# Patient Record
Sex: Female | Born: 1955 | Race: White | Hispanic: No | Marital: Married | Smoking: Never smoker
Health system: Southern US, Community
[De-identification: ages and names within clinical notes are randomized; demographics above are authoritative.]

## PROBLEM LIST (undated history)

## (undated) DIAGNOSIS — F32A Depression, unspecified: Secondary | ICD-10-CM

## (undated) DIAGNOSIS — Z973 Presence of spectacles and contact lenses: Secondary | ICD-10-CM

## (undated) DIAGNOSIS — Z789 Other specified health status: Secondary | ICD-10-CM

## (undated) DIAGNOSIS — F329 Major depressive disorder, single episode, unspecified: Secondary | ICD-10-CM

## (undated) DIAGNOSIS — F419 Anxiety disorder, unspecified: Secondary | ICD-10-CM

## (undated) HISTORY — PX: COLONOSCOPY: SHX174

## (undated) HISTORY — DX: Anxiety disorder, unspecified: F41.9

## (undated) HISTORY — PX: WISDOM TOOTH EXTRACTION: SHX21

---

## 1999-03-13 ENCOUNTER — Other Ambulatory Visit: Admission: RE | Admit: 1999-03-13 | Discharge: 1999-03-13 | Payer: Self-pay | Admitting: Obstetrics and Gynecology

## 2000-05-14 ENCOUNTER — Other Ambulatory Visit: Admission: RE | Admit: 2000-05-14 | Discharge: 2000-05-14 | Payer: Self-pay | Admitting: Obstetrics and Gynecology

## 2000-05-27 ENCOUNTER — Encounter: Payer: Self-pay | Admitting: Obstetrics and Gynecology

## 2000-05-27 ENCOUNTER — Encounter: Admission: RE | Admit: 2000-05-27 | Discharge: 2000-05-27 | Payer: Self-pay | Admitting: Obstetrics and Gynecology

## 2001-05-27 ENCOUNTER — Other Ambulatory Visit: Admission: RE | Admit: 2001-05-27 | Discharge: 2001-05-27 | Payer: Self-pay | Admitting: Obstetrics and Gynecology

## 2002-06-07 ENCOUNTER — Other Ambulatory Visit: Admission: RE | Admit: 2002-06-07 | Discharge: 2002-06-07 | Payer: Self-pay | Admitting: Obstetrics and Gynecology

## 2003-07-01 ENCOUNTER — Other Ambulatory Visit: Admission: RE | Admit: 2003-07-01 | Discharge: 2003-07-01 | Payer: Self-pay | Admitting: Obstetrics and Gynecology

## 2003-09-02 ENCOUNTER — Encounter: Admission: RE | Admit: 2003-09-02 | Discharge: 2003-09-02 | Payer: Self-pay | Admitting: Family Medicine

## 2004-07-03 ENCOUNTER — Other Ambulatory Visit: Admission: RE | Admit: 2004-07-03 | Discharge: 2004-07-03 | Payer: Self-pay | Admitting: Obstetrics and Gynecology

## 2004-08-01 ENCOUNTER — Encounter: Admission: RE | Admit: 2004-08-01 | Discharge: 2004-08-01 | Payer: Self-pay | Admitting: Obstetrics and Gynecology

## 2005-03-28 ENCOUNTER — Encounter: Admission: RE | Admit: 2005-03-28 | Discharge: 2005-03-28 | Payer: Self-pay | Admitting: Family Medicine

## 2005-07-09 ENCOUNTER — Other Ambulatory Visit: Admission: RE | Admit: 2005-07-09 | Discharge: 2005-07-09 | Payer: Self-pay | Admitting: Obstetrics and Gynecology

## 2005-11-10 ENCOUNTER — Inpatient Hospital Stay (HOSPITAL_COMMUNITY): Admission: AD | Admit: 2005-11-10 | Discharge: 2005-11-12 | Payer: Self-pay | Admitting: *Deleted

## 2005-11-10 ENCOUNTER — Ambulatory Visit: Payer: Self-pay | Admitting: Infectious Diseases

## 2006-07-28 ENCOUNTER — Other Ambulatory Visit: Admission: RE | Admit: 2006-07-28 | Discharge: 2006-07-28 | Payer: Self-pay | Admitting: Obstetrics & Gynecology

## 2007-02-17 ENCOUNTER — Encounter: Admission: RE | Admit: 2007-02-17 | Discharge: 2007-02-17 | Payer: Self-pay | Admitting: Obstetrics and Gynecology

## 2007-05-27 ENCOUNTER — Encounter: Admission: RE | Admit: 2007-05-27 | Discharge: 2007-05-27 | Payer: Self-pay | Admitting: Family Medicine

## 2007-07-30 ENCOUNTER — Other Ambulatory Visit: Admission: RE | Admit: 2007-07-30 | Discharge: 2007-07-30 | Payer: Self-pay | Admitting: Obstetrics and Gynecology

## 2008-10-04 ENCOUNTER — Other Ambulatory Visit: Admission: RE | Admit: 2008-10-04 | Discharge: 2008-10-04 | Payer: Self-pay | Admitting: Obstetrics & Gynecology

## 2008-11-09 ENCOUNTER — Encounter: Admission: RE | Admit: 2008-11-09 | Discharge: 2008-11-09 | Payer: Self-pay | Admitting: Obstetrics and Gynecology

## 2010-01-15 ENCOUNTER — Encounter: Admission: RE | Admit: 2010-01-15 | Discharge: 2010-01-15 | Payer: Self-pay | Admitting: Obstetrics and Gynecology

## 2010-10-04 ENCOUNTER — Encounter
Admission: RE | Admit: 2010-10-04 | Discharge: 2010-10-04 | Payer: Self-pay | Source: Home / Self Care | Admitting: Family Medicine

## 2010-11-19 ENCOUNTER — Encounter: Payer: Self-pay | Admitting: Family Medicine

## 2011-03-15 NOTE — Discharge Summary (Signed)
NAMESHARLEE, RUFINO                    ACCOUNT NO.:  [id number]   MEDICAL RECORD NO.:  [id number]          PATIENT TYPE:  INP   LOCATION:  3702                         FACILITY:  [facility]   PHYSICIAN:  Theone Stanley, MD   DATE OF BIRTH:  11/22/1955   DATE OF ADMISSION:  11/10/2005  DATE OF DISCHARGE:  11/12/2005                                 DISCHARGE SUMMARY   ADMITTING DIAGNOSES:  1.  Headache.  2.  Myalgias.  3.  Nausea and vomiting.  4.  Rash.  5.  Depression.  6.  History of migraine.   DISCHARGE DIAGNOSES:  1.  Viral syndrome.  2.  History of migraines.  3.  Depression.   CONSULTS:  Dr. Roxan Hockey with ID   PROCEDURES/DIAGNOSTIC TESTS:  Patient had an LP performed on November 10, 2005.  CSF culture was negative x1 day.  Cryptococcal was negative.  Minimal  white count.   HOSPITAL COURSE:  Mrs. Longino is a 55 year old female who on Wednesday had a  headache and pain in the lower legs.  Thursday felt a little better but  subsequently her symptoms worsened.  She has had myalgias for the past one  to two days and nausea and vomiting since Friday.  She subsequently had a  rash on her stomach and legs.  She has a history of migraines so she took  some Maxalt ; however, this did not help her.  She denied being around any  sick contacts and presented to the hospital because of symptoms.  Her  presentation was concerning for meningitis.  LP was performed and infectious  disease was consulted.  Based on the results of the LP, this was concluded  to be secondary to a viral syndrome which Dr. Roxan Hockey stated was the most  likely cause.  Because of this and the fact that her symptoms improved, she  was discharged on November 12, 2005 in stable, improved condition.   DISCHARGE MEDICATIONS:  1.  Prozac 20 mg one p.o. daily.  2.  Lo-Ovral one p.o. daily.  3.  Maxalt one p.o. p.r.n. for migraines.  4.  Motrin 800 mg one p.o. t.i.d. p.r.n.   She is to follow up with primary care physician  in one week's time.      Theone Stanley, MD  Electronically Signed     AEJ/MEDQ  D:  03/01/2006  T:  03/03/2006  Job:  [id number] [id number]

## 2012-07-29 ENCOUNTER — Other Ambulatory Visit: Payer: Self-pay | Admitting: Obstetrics and Gynecology

## 2012-07-29 DIAGNOSIS — Z1231 Encounter for screening mammogram for malignant neoplasm of breast: Secondary | ICD-10-CM

## 2012-08-20 ENCOUNTER — Ambulatory Visit
Admission: RE | Admit: 2012-08-20 | Discharge: 2012-08-20 | Disposition: A | Discharge status: Home / Self Care | Payer: BC Managed Care – PPO | Source: Ambulatory Visit | Attending: Obstetrics and Gynecology | Admitting: Obstetrics and Gynecology

## 2012-08-20 DIAGNOSIS — Z1231 Encounter for screening mammogram for malignant neoplasm of breast: Secondary | ICD-10-CM

## 2013-08-04 ENCOUNTER — Other Ambulatory Visit (HOSPITAL_COMMUNITY)
Admission: RE | Admit: 2013-08-04 | Discharge: 2013-08-04 | Disposition: A | Discharge status: Home / Self Care | Payer: BC Managed Care – PPO | Source: Ambulatory Visit | Attending: Obstetrics and Gynecology | Admitting: Obstetrics and Gynecology

## 2013-08-04 ENCOUNTER — Other Ambulatory Visit: Payer: Self-pay | Admitting: Obstetrics and Gynecology

## 2013-08-04 DIAGNOSIS — Z1151 Encounter for screening for human papillomavirus (HPV): Secondary | ICD-10-CM | POA: Insufficient documentation

## 2013-08-04 DIAGNOSIS — Z01419 Encounter for gynecological examination (general) (routine) without abnormal findings: Secondary | ICD-10-CM | POA: Insufficient documentation

## 2013-12-22 ENCOUNTER — Other Ambulatory Visit: Payer: Self-pay | Admitting: Otolaryngology

## 2013-12-22 ENCOUNTER — Encounter (HOSPITAL_BASED_OUTPATIENT_CLINIC_OR_DEPARTMENT_OTHER): Payer: Self-pay | Admitting: *Deleted

## 2013-12-22 NOTE — H&P (Signed)
Savannah Herring,  Savannah Herring 58 y.o., female [id number]     Chief Complaint: Nasal septal mass  HPI: 58-year-old white female comes in noting that her left ear has seemed mildly clogged off and on for the past 3-4 months.    For at least some portion of most days, she will notice this.  She does not really think her hearing is impaired.  Dr. Griffin ask her to try and antihistamine decongestant combination without any obvious improvement.  She does not think she has any chronic TMJ or bruxism issues.  No specific dental pathology.  She feels like she may have some mild environmental allergies.  She does not smoke.  Presently, no facial pain nasal congestion or drainage.  She has some RIGHT shoulder and clavicle pain which makes it difficult for her to lie on the RIGHT side and wonders if this might be affecting her LEFT side somehow.  No imaging thus far.  No hearing issues.  One month return visit.  The CT scan did not show any active sinus disease, but a presumed mass versus cyst of the high anterior RIGHT nasal septum.  She really has not been having any symptoms in this region.  She recalled having had a CT scan some years ago.  We discovered a CT head at [facility] dated 2007.  The orientation is slightly different from our current scan, but it appears that the presumed mass was not present at that time  One-week recheck.  Preoperative visit.  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  I discussed the surgery including biopsy with frozen section interpretation and possible complete excision.  Possible modified septoplasty.  We will use the STEALTH apparatus for orientation.  I discussed risks and complications.  Questions were answered and informed consent was obtained.   I will discuss diet and activity and postoperative wound hygiene after surgery.  I am giving her prescriptions for hydrocodone and Keflex.  PMH: Past Medical History  Diagnosis Date  . Medical  history non-contributory   . Depression   . Wears glasses     Surg Hx: Past Surgical History  Procedure Laterality Date  . Wisdom tooth extraction    . Colonoscopy      FHx:  No family history on file. SocHx:  reports that she has never smoked. She does not have any smokeless tobacco history on file. She reports that she drinks alcohol. She reports that she does not use illicit drugs.  ALLERGIES:  Allergies  Allergen Reactions  . Sulfa Antibiotics Hives     (Not in a hospital admission)  No results found for this or any previous visit (from the past 48 hour(s)). No results found.   BP:117/71,  HR: 72 b/min,  Height: 5 ft 7.5 in, Weight: 150 lb , BMI: 23.1 kg/m2,   PHYSICAL EXAM: Once again she has a high LEFT anterior septal deviation.  She has some focal fullness on the high RIGHT anterior septum which may constitute the mass of concern.  No mucosal irregularity.  Oral cavity and pharynx clear.  Neck unremarkable.   Lungs: Clear to auscultation Heart: Regular rate and rhythm without murmur Abdomen: Soft, active Extremities: Normal configuration Neurologic: Grossly symmetric and intact.  Studies Reviewed:  Using the nasal endoscope, again no specific mucosal abnormality or lesion.  CT scan of the paranasal sinuses.    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  This surgery may be limited to a   biopsy, or may be amenable to complete removal when I know what I'm dealing with.  I will give you more complete instructions before you go home from the hospital.  For now, I will leave you a  prescription for Vicodin for pain relief and Keflex antibiotic.  Cephalexin 500 MG Oral Capsule;TAKE 1 CAPSULE 4 TIMES DAILY; Qty30; R0; Rx. Hydrocodone-Acetaminophen 5-325 MG Oral Tablet;1-2 po q4-6h prn pain; Qty40; R0; Rx.  Timmi Devora 12/22/2013, 6:36 PM     

## 2013-12-22 NOTE — Progress Notes (Signed)
No labs needed per anesthesia 

## 2013-12-27 ENCOUNTER — Ambulatory Visit (HOSPITAL_BASED_OUTPATIENT_CLINIC_OR_DEPARTMENT_OTHER): Payer: BC Managed Care – PPO | Admitting: Anesthesiology

## 2013-12-27 ENCOUNTER — Encounter (HOSPITAL_BASED_OUTPATIENT_CLINIC_OR_DEPARTMENT_OTHER)
Admission: RE | Disposition: A | Discharge status: Home / Self Care | Payer: Self-pay | Source: Ambulatory Visit | Attending: Otolaryngology

## 2013-12-27 ENCOUNTER — Encounter (HOSPITAL_BASED_OUTPATIENT_CLINIC_OR_DEPARTMENT_OTHER): Payer: BC Managed Care – PPO | Admitting: Anesthesiology

## 2013-12-27 ENCOUNTER — Ambulatory Visit (HOSPITAL_BASED_OUTPATIENT_CLINIC_OR_DEPARTMENT_OTHER)
Admission: RE | Admit: 2013-12-27 | Discharge: 2013-12-27 | Disposition: A | Discharge status: Home / Self Care | Payer: BC Managed Care – PPO | Source: Ambulatory Visit | Attending: Otolaryngology | Admitting: Otolaryngology

## 2013-12-27 ENCOUNTER — Encounter (HOSPITAL_BASED_OUTPATIENT_CLINIC_OR_DEPARTMENT_OTHER): Payer: Self-pay | Admitting: *Deleted

## 2013-12-27 DIAGNOSIS — F329 Major depressive disorder, single episode, unspecified: Secondary | ICD-10-CM | POA: Insufficient documentation

## 2013-12-27 DIAGNOSIS — J3489 Other specified disorders of nose and nasal sinuses: Secondary | ICD-10-CM | POA: Insufficient documentation

## 2013-12-27 DIAGNOSIS — F3289 Other specified depressive episodes: Secondary | ICD-10-CM | POA: Insufficient documentation

## 2013-12-27 DIAGNOSIS — J341 Cyst and mucocele of nose and nasal sinus: Secondary | ICD-10-CM

## 2013-12-27 HISTORY — DX: Major depressive disorder, single episode, unspecified: F32.9

## 2013-12-27 HISTORY — DX: Other specified health status: Z78.9

## 2013-12-27 HISTORY — DX: Depression, unspecified: F32.A

## 2013-12-27 HISTORY — PX: SINUS ENDO W/FUSION: SHX777

## 2013-12-27 HISTORY — DX: Presence of spectacles and contact lenses: Z97.3

## 2013-12-27 SURGERY — SINUS SURGERY, ENDOSCOPIC, USING COMPUTER-ASSISTED NAVIGATION
Anesthesia: General | Site: Nose | Laterality: Right

## 2013-12-27 MED ORDER — GLYCOPYRROLATE 0.2 MG/ML IJ SOLN
INTRAMUSCULAR | Status: DC | PRN
  Administered 2013-12-27: 0.2 mg via INTRAVENOUS

## 2013-12-27 MED ORDER — PROPOFOL 10 MG/ML IV BOLUS
INTRAVENOUS | Status: DC | PRN
  Administered 2013-12-27: 150 mg via INTRAVENOUS
  Administered 2013-12-27: 50 mg via INTRAVENOUS

## 2013-12-27 MED ORDER — COCAINE HCL 4 % EX SOLN
CUTANEOUS | Status: AC
  Filled 2013-12-27: qty 4

## 2013-12-27 MED ORDER — FENTANYL CITRATE 0.05 MG/ML IJ SOLN
INTRAMUSCULAR | Status: AC
  Filled 2013-12-27: qty 2

## 2013-12-27 MED ORDER — OXYMETAZOLINE HCL 0.05 % NA SOLN
2.0000 | NASAL | Status: AC
  Administered 2013-12-27 (×2): 2 via NASAL

## 2013-12-27 MED ORDER — EPHEDRINE SULFATE 50 MG/ML IJ SOLN
INTRAMUSCULAR | Status: DC | PRN
  Administered 2013-12-27: 5 mg via INTRAVENOUS

## 2013-12-27 MED ORDER — FENTANYL CITRATE 0.05 MG/ML IJ SOLN
INTRAMUSCULAR | Status: AC
  Filled 2013-12-27: qty 6

## 2013-12-27 MED ORDER — DEXAMETHASONE SODIUM PHOSPHATE 4 MG/ML IJ SOLN
INTRAMUSCULAR | Status: DC | PRN
  Administered 2013-12-27: 10 mg via INTRAVENOUS

## 2013-12-27 MED ORDER — PHENYLEPHRINE HCL 10 MG/ML IJ SOLN
INTRAMUSCULAR | Status: DC | PRN
  Administered 2013-12-27 (×2): 100 ug via INTRAVENOUS

## 2013-12-27 MED ORDER — BACITRACIN ZINC 500 UNIT/GM EX OINT
TOPICAL_OINTMENT | CUTANEOUS | Status: AC
  Filled 2013-12-27: qty 28.35

## 2013-12-27 MED ORDER — OXYMETAZOLINE HCL 0.05 % NA SOLN
NASAL | Status: DC | PRN
  Administered 2013-12-27: 1 via NASAL

## 2013-12-27 MED ORDER — MIDAZOLAM HCL 5 MG/5ML IJ SOLN
INTRAMUSCULAR | Status: DC | PRN
  Administered 2013-12-27: 2 mg via INTRAVENOUS

## 2013-12-27 MED ORDER — LACTATED RINGERS IV SOLN
INTRAVENOUS | Status: DC
  Administered 2013-12-27: 07:00:00 via INTRAVENOUS

## 2013-12-27 MED ORDER — CEFAZOLIN SODIUM-DEXTROSE 2-3 GM-% IV SOLR: 2.0000 g | INTRAVENOUS | Status: DC

## 2013-12-27 MED ORDER — SUCCINYLCHOLINE CHLORIDE 20 MG/ML IJ SOLN
INTRAMUSCULAR | Status: DC | PRN
  Administered 2013-12-27: 50 mg via INTRAVENOUS

## 2013-12-27 MED ORDER — OXYMETAZOLINE HCL 0.05 % NA SOLN
NASAL | Status: AC
  Filled 2013-12-27: qty 15

## 2013-12-27 MED ORDER — MIDAZOLAM HCL 2 MG/2ML IJ SOLN: 1.0000 mg | INTRAMUSCULAR | Status: DC | PRN

## 2013-12-27 MED ORDER — LIDOCAINE HCL (CARDIAC) 20 MG/ML IV SOLN
INTRAVENOUS | Status: DC | PRN
  Administered 2013-12-27: 40 mg via INTRAVENOUS

## 2013-12-27 MED ORDER — HYDROMORPHONE HCL PF 1 MG/ML IJ SOLN: 0.2500 mg | INTRAMUSCULAR | Status: DC | PRN

## 2013-12-27 MED ORDER — FENTANYL CITRATE 0.05 MG/ML IJ SOLN: 50.0000 ug | INTRAMUSCULAR | Status: DC | PRN

## 2013-12-27 MED ORDER — OXYCODONE HCL 5 MG PO TABS: 5.0000 mg | ORAL_TABLET | Freq: Once | ORAL | Status: DC | PRN

## 2013-12-27 MED ORDER — CIPROFLOXACIN-HYDROCORTISONE 0.2-1 % OT SUSP
OTIC | Status: AC
  Filled 2013-12-27: qty 10

## 2013-12-27 MED ORDER — CIPROFLOXACIN-DEXAMETHASONE 0.3-0.1 % OT SUSP
OTIC | Status: AC
  Filled 2013-12-27: qty 7.5

## 2013-12-27 MED ORDER — FENTANYL CITRATE 0.05 MG/ML IJ SOLN
INTRAMUSCULAR | Status: DC | PRN
  Administered 2013-12-27 (×2): 25 ug via INTRAVENOUS
  Administered 2013-12-27: 100 ug via INTRAVENOUS

## 2013-12-27 MED ORDER — ONDANSETRON HCL 4 MG/2ML IJ SOLN
INTRAMUSCULAR | Status: DC | PRN
  Administered 2013-12-27: 4 mg via INTRAVENOUS

## 2013-12-27 MED ORDER — LIDOCAINE-EPINEPHRINE 1 %-1:100000 IJ SOLN
INTRAMUSCULAR | Status: DC | PRN
  Administered 2013-12-27: 10 mL

## 2013-12-27 MED ORDER — LIDOCAINE HCL (CARDIAC) 20 MG/ML IV SOLN: INTRAVENOUS | Status: DC | PRN

## 2013-12-27 MED ORDER — PROPOFOL 10 MG/ML IV BOLUS
INTRAVENOUS | Status: AC
  Filled 2013-12-27: qty 20

## 2013-12-27 MED ORDER — LIDOCAINE-EPINEPHRINE 1 %-1:100000 IJ SOLN
INTRAMUSCULAR | Status: AC
  Filled 2013-12-27: qty 1

## 2013-12-27 MED ORDER — MIDAZOLAM HCL 2 MG/2ML IJ SOLN
INTRAMUSCULAR | Status: AC
  Filled 2013-12-27: qty 2

## 2013-12-27 MED ORDER — OXYCODONE HCL 5 MG/5ML PO SOLN
5.0000 mg | Freq: Once | ORAL | Status: DC | PRN
Start: 2013-12-27 — End: 2013-12-27

## 2013-12-27 MED ORDER — BACITRACIN-NEOMYCIN-POLYMYXIN 400-5-5000 EX OINT
TOPICAL_OINTMENT | CUTANEOUS | Status: AC
  Filled 2013-12-27: qty 1

## 2013-12-27 MED ORDER — CEFAZOLIN SODIUM-DEXTROSE 2-3 GM-% IV SOLR
INTRAVENOUS | Status: AC
  Filled 2013-12-27: qty 50

## 2013-12-27 SURGICAL SUPPLY — 62 items
AIRWAY NASO PHAR 26FR 6.5 (TUBING)
AIRWAY NASOPHAR 26 6.5 (TUBING) IMPLANT
BLADE RAD40 ROTATE 4M 4 5PK (BLADE) IMPLANT
BLADE RAD60 ROTATE M4 4 5PK (BLADE) IMPLANT
BLADE ROTATE RAD 12 4 M4 (BLADE) IMPLANT
BLADE ROTATE RAD 40 4 M4 (BLADE) IMPLANT
BLADE ROTATE TRICUT 4X13 M4 (BLADE) ×2 IMPLANT
BLADE TRICUT ROTATE M4 4 5PK (BLADE) IMPLANT
BUR HS RAD FRONTAL 3 (BURR) IMPLANT
BUR SPHERICAL 2.9 (BURR) IMPLANT
CANISTER SUC SOCK COL 7IN (MISCELLANEOUS) IMPLANT
CANISTER SUCT 1200ML W/VALVE (MISCELLANEOUS) ×2 IMPLANT
COAGULATOR SUCT 8FR VV (MISCELLANEOUS) ×2 IMPLANT
COTTONBALL LRG STERILE PKG (GAUZE/BANDAGES/DRESSINGS) ×2 IMPLANT
DECANTER SPIKE VIAL GLASS SM (MISCELLANEOUS) IMPLANT
DEPRESSOR TONGUE BLADE STERILE (MISCELLANEOUS) ×4 IMPLANT
DRAPE SURG 17X23 STRL (DRAPES) ×2 IMPLANT
DRESSING NASAL KENNEDY 3.5X.9 (MISCELLANEOUS) ×1 IMPLANT
DRSG NASAL KENNEDY 3.5X.9 (MISCELLANEOUS) ×2
DRSG NASOPORE 8CM (GAUZE/BANDAGES/DRESSINGS) IMPLANT
DRSG TELFA 3X8 NADH (GAUZE/BANDAGES/DRESSINGS) IMPLANT
ELECT REM PT RETURN 9FT ADLT (ELECTROSURGICAL) ×2
ELECTRODE REM PT RTRN 9FT ADLT (ELECTROSURGICAL) ×1 IMPLANT
GAUZE PACKING FOLDED 2  STR (GAUZE/BANDAGES/DRESSINGS) ×1
GAUZE PACKING FOLDED 2 STR (GAUZE/BANDAGES/DRESSINGS) ×1 IMPLANT
GLOVE ECLIPSE 8.0 STRL XLNG CF (GLOVE) ×4 IMPLANT
GLOVE SURG SS PI 7.0 STRL IVOR (GLOVE) ×2 IMPLANT
GOWN STRL REUS W/ TWL LRG LVL3 (GOWN DISPOSABLE) ×1 IMPLANT
GOWN STRL REUS W/ TWL XL LVL3 (GOWN DISPOSABLE) ×1 IMPLANT
GOWN STRL REUS W/TWL LRG LVL3 (GOWN DISPOSABLE) ×1
GOWN STRL REUS W/TWL XL LVL3 (GOWN DISPOSABLE) ×1
IV NS 1000ML (IV SOLUTION)
IV NS 1000ML BAXH (IV SOLUTION) IMPLANT
IV NS 500ML (IV SOLUTION) ×1
IV NS 500ML BAXH (IV SOLUTION) ×1 IMPLANT
IV NS IRRIG 3000ML ARTHROMATIC (IV SOLUTION) IMPLANT
NEEDLE HYPO 25X1 1.5 SAFETY (NEEDLE) ×2 IMPLANT
NEEDLE SPNL 25GX3.5 QUINCKE BL (NEEDLE) ×2 IMPLANT
NS IRRIG 1000ML POUR BTL (IV SOLUTION) IMPLANT
PACK BASIN DAY SURGERY FS (CUSTOM PROCEDURE TRAY) ×2 IMPLANT
PACK ENT DAY SURGERY (CUSTOM PROCEDURE TRAY) ×2 IMPLANT
PAD ENT ADHESIVE 25PK (MISCELLANEOUS) ×2 IMPLANT
PATTIES SURGICAL .5 X3 (DISPOSABLE) ×2 IMPLANT
SHEATH ENDOSCRUB 0 DEG (SHEATH) ×2 IMPLANT
SHEATH ENDOSCRUB 30 DEG (SHEATH) IMPLANT
SLEEVE SCD COMPRESS KNEE MED (MISCELLANEOUS) ×2 IMPLANT
SOLUTION ANTI FOG 6CC (MISCELLANEOUS) ×2 IMPLANT
SPONGE GAUZE 2X2 8PLY STRL LF (GAUZE/BANDAGES/DRESSINGS) ×2 IMPLANT
SPONGE SURGIFOAM ABS GEL 12-7 (HEMOSTASIS) IMPLANT
SUT CHROMIC 4 0 P 3 18 (SUTURE) IMPLANT
SUT ETHILON 3 0 PS 1 (SUTURE) IMPLANT
SUT PDS AB 4-0 P3 18 (SUTURE) IMPLANT
SUT PLAIN 4 0 ~~LOC~~ 1 (SUTURE) IMPLANT
SUT SILK 2 0 FS (SUTURE) IMPLANT
TOWEL OR 17X24 6PK STRL BLUE (TOWEL DISPOSABLE) ×2 IMPLANT
TRACKER ENT INSTRUMENT (MISCELLANEOUS) ×2 IMPLANT
TRACKER ENT PATIENT (MISCELLANEOUS) ×2 IMPLANT
TRAY DSU PREP LF (CUSTOM PROCEDURE TRAY) ×2 IMPLANT
TUBE ANAEROBIC SPECIMEN COL (MISCELLANEOUS) IMPLANT
TUBE CONNECTING 20X1/4 (TUBING) ×2 IMPLANT
TUBING STRAIGHTSHOT EPS 5PK (TUBING) IMPLANT
YANKAUER SUCT BULB TIP NO VENT (SUCTIONS) ×2 IMPLANT

## 2013-12-27 NOTE — Transfer of Care (Signed)
Immediate Anesthesia Transfer of Care Note  Patient: Savannah HearingJane C Bowmer  Procedure(s) Performed: Procedure(s): ENDOSCOPIC SINUS SURGERY WITH BIOPSY, FROZEN SECTION,EXCISION RIGHT NASAL SEPTAL MASS ANDFUSION NAVIGATION (Right)  Patient Location: PACU  Anesthesia Type:General  Level of Consciousness: awake and alert   Airway & Oxygen Therapy: Patient Spontanous Breathing and Patient connected to face mask oxygen  Post-op Assessment: Report given to PACU RN and Post -op Vital signs reviewed and stable  Post vital signs: Reviewed and stable  Complications: No apparent anesthesia complications

## 2013-12-27 NOTE — Interval H&P Note (Signed)
History and Physical Interval Note:  12/27/2013 7:34 AM  [name]  has presented today for surgery, with the diagnosis of RIGHT NASAL SEPTAL LESION  The various methods of treatment have been discussed with the patient and family. After consideration of risks, benefits and other options for treatment, the patient has consented to  Procedure(s): ENDOSCOPIC SINUS SURGERY WITH BIOPSY, FROZEN SECTION,EXCISION RIGHT NASAL SEPTAL MASS ANS POSSIBLE SEPTOPLSTY FUSION NAVIGATION (Right) as a surgical intervention .  The patient's history has been re-reviewed, patient re-examined, no change in status, stable for surgery.  I have re-reviewed the patient's chart and labs.  Questions were answered to the patient's satisfaction.     Flo ShanksWOLICKI, Savannah Herring

## 2013-12-27 NOTE — Discharge Instructions (Signed)

## 2013-12-27 NOTE — Op Note (Signed)
12/27/2013  9:02 AM    Hester MatesLaw, Savannah  [id number]   Pre-Op Dx: Right Nasal septal lesion  Post-op Dx: Right nasal septal mucous retention cyst  Proc: Excision, right nasal septal cyst with frozen section   Surg:  Flo ShanksWOLICKI, [name] MD  Anes:  GOT  EBL:  Minimal  Comp:  None  Findings:  A cystic structure with thick cloudy mucus. Scalloping of the bony septum. Frozen section benign. Overall right and left corrugation of the septum.  Procedure: With the patient in a comfortable supine position, general orotracheal anesthesia was induced without difficulty. At an appropriate level, the patient was placed in a slight sitting position with the head rotated to the right. A saline moistened throat pack was placed. Nasal vibrissae were trimmed. She had received preoperative Afrin spray. Additional Afrin solution was applied on 0.5 x 3" cottonoids to both sides of the nasal septum. 1% Xylocaine with 1 100,000 epinephrine, 10 cc total was infiltrated into the anterior floor of the nose on both sides, into the membranous columella, and into the submucoperichondrial plane of the septum on both sides.  The Fusion apparatus was replaced and registered in the standard fashion.  A sterile preparation and draping of the midface was accomplished in the standard fashion.  The materials were removed from the nose and observed to be intact and correct in number.  The nose was inspected with the findings as described above. A straight probe was calibrated to the fusion system. The location of the lesion was identified.  A 15 blade was used to incise the mucosa over the lesion producing a small amount of thick cloudy mucous. There really was no solid component to be biopsied. The edges of the septal mucosa at the incision site were biopsied and sent for frozen section interpretation.  Given the character of the lesion, it was elected to marsupialize the apparent cyst. Mucosal incisions were made  circumferentially around the lesion and the dissection was carried off of the septal cartilage and bone of the perpendicular plate using a Cottle elevator. The mucosa of the hemispheric bone cavity was carefully dissected. The specimen was delivered intact. Cut mucosal edges were controlled with suction cautery.  2 small Merocel packs were placed over the region and moistened with Cipro HC otic solution.  Hemostasis was observed.  The frozen section interpretation returned as benign.  At this point the procedure was completed.  The pharynx was suctioned clear of a small amount of blood and secretions and the throat pack was removed. The patient was returned to anesthesia, awakened, extubated, and transferred to recovery in stable condition.   Dispo:   PACU to home  Plan:  Ice, elevation, analgesia, antibiosis. We'll remove the intranasal packs in 2 days. Given low anticipated risk of postanesthetic or postsurgical complications, feel an outpatient venue is appropriate.  Cephus RicherWOLICKI,  Addylin Manke T MD

## 2013-12-27 NOTE — Anesthesia Preprocedure Evaluation (Signed)
Anesthesia Evaluation  Patient identified by MRN, date of birth, ID band Patient awake    Reviewed: Allergy & Precautions, H&P , NPO status , Patient's Chart, lab work & pertinent test results  Airway Mallampati: II TM Distance: >3 FB Neck ROM: Full    Dental no notable dental hx. (+) Teeth Intact, Dental Advisory Given   Pulmonary neg pulmonary ROS,  breath sounds clear to auscultation  Pulmonary exam normal       Cardiovascular negative cardio ROS  Rhythm:Regular Rate:Normal     Neuro/Psych Depression negative neurological ROS     GI/Hepatic negative GI ROS, Neg liver ROS,   Endo/Other  negative endocrine ROS  Renal/GU negative Renal ROS  negative genitourinary   Musculoskeletal   Abdominal   Peds  Hematology negative hematology ROS (+)   Anesthesia Other Findings   Reproductive/Obstetrics negative OB ROS                          Anesthesia Physical Anesthesia Plan  ASA: II  Anesthesia Plan: General   Post-op Pain Management:    Induction: Intravenous  Airway Management Planned: Oral ETT  Additional Equipment:   Intra-op Plan:   Post-operative Plan: Extubation in OR  Informed Consent: I have reviewed the patients History and Physical, chart, labs and discussed the procedure including the risks, benefits and alternatives for the proposed anesthesia with the patient or authorized representative who has indicated his/her understanding and acceptance.   Dental advisory given  Plan Discussed with: CRNA  Anesthesia Plan Comments:         Anesthesia Quick Evaluation  

## 2013-12-27 NOTE — Anesthesia Postprocedure Evaluation (Signed)
  Anesthesia Post-op Note  Patient: Savannah Herring  Procedure(s) Performed: Procedure(s): ENDOSCOPIC SINUS SURGERY WITH BIOPSY, FROZEN SECTION,EXCISION RIGHT NASAL SEPTAL MASS ANDFUSION NAVIGATION (Right)  Patient Location: PACU  Anesthesia Type:General  Level of Consciousness: awake and alert   Airway and Oxygen Therapy: Patient Spontanous Breathing  Post-op Pain: none  Post-op Assessment: Post-op Vital signs reviewed, Patient's Cardiovascular Status Stable and Respiratory Function Stable  Post-op Vital Signs: Reviewed  Filed Vitals:   12/27/13 0945  BP: 122/66  Pulse: 71  Temp:   Resp: 11    Complications: No apparent anesthesia complications

## 2013-12-27 NOTE — Anesthesia Procedure Notes (Signed)
Procedure Name: Intubation Date/Time: 12/27/2013 7:56 AM Performed by: Genevieve NorlanderLINKA, Savannah Herring Pre-anesthesia Checklist: Patient identified, Emergency Drugs available, Suction available, Patient being monitored and Timeout performed Patient Re-evaluated:Patient Re-evaluated prior to inductionOxygen Delivery Method: Circle System Utilized Preoxygenation: Pre-oxygenation with 100% oxygen Intubation Type: IV induction Ventilation: Mask ventilation without difficulty Laryngoscope Size: Miller and 3 Grade View: Grade IV Tube type: Oral Tube size: 7.0 mm Number of attempts: 2 Airway Equipment and Method: stylet,  oral airway and Patient positioned with wedge pillow Placement Confirmation: ETT inserted through vocal cords under direct vision,  positive ETCO2 and breath sounds checked- equal and bilateral Secured at: 21 cm Tube secured with: Tape Dental Injury: Teeth and Oropharynx as per pre-operative assessment and Injury to lip  Difficulty Due To: Difficult Airway- due to anterior larynx

## 2013-12-27 NOTE — H&P (View-Only) (Signed)
Savannah Herring, Novitski 58 y.o., female [id number]     Chief Complaint: Nasal septal mass  HPI: 58 year old white female comes in noting that her left ear has seemed mildly clogged off and on for the past 3-4 months.    For at least some portion of most days, she will notice this.  She does not really think her hearing is impaired.  Dr. Valentina Lucks ask her to try and antihistamine decongestant combination without any obvious improvement.  She does not think she has any chronic TMJ or bruxism issues.  No specific dental pathology.  She feels like she may have some mild environmental allergies.  She does not smoke.  Presently, no facial pain nasal congestion or drainage.  She has some RIGHT shoulder and clavicle pain which makes it difficult for her to lie on the RIGHT side and wonders if this might be affecting her LEFT side somehow.  No imaging thus far.  No hearing issues.  One month return visit.  The CT scan did not show any active sinus disease, but a presumed mass versus cyst of the high anterior RIGHT nasal septum.  She really has not been having any symptoms in this region.  She recalled having had a CT scan some years ago.  We discovered a CT head at [facility] dated 2007.  The orientation is slightly different from our current scan, but it appears that the presumed mass was not present at that time  One-week recheck.  Preoperative visit.  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  I discussed the surgery including biopsy with frozen section interpretation and possible complete excision.  Possible modified septoplasty.  We will use the STEALTH apparatus for orientation.  I discussed risks and complications.  Questions were answered and informed consent was obtained.   I will discuss diet and activity and postoperative wound hygiene after surgery.  I am giving her prescriptions for hydrocodone and Keflex.  PMH: Past Medical History  Diagnosis Date  . Medical  history non-contributory   . Depression   . Wears glasses     Surg Hx: Past Surgical History  Procedure Laterality Date  . Wisdom tooth extraction    . Colonoscopy      FHx:  No family history on file. SocHx:  reports that she has never smoked. She does not have any smokeless tobacco history on file. She reports that she drinks alcohol. She reports that she does not use illicit drugs.  ALLERGIES:  Allergies  Allergen Reactions  . Sulfa Antibiotics Hives     (Not in a hospital admission)  No results found for this or any previous visit (from the past 48 hour(s)). No results found.   BP:117/71,  HR: 72 b/min,  Height: 5 ft 7.5 in, Weight: 150 lb , BMI: 23.1 kg/m2,   PHYSICAL EXAM: Once again she has a high LEFT anterior septal deviation.  She has some focal fullness on the high RIGHT anterior septum which may constitute the mass of concern.  No mucosal irregularity.  Oral cavity and pharynx clear.  Neck unremarkable.   Lungs: Clear to auscultation Heart: Regular rate and rhythm without murmur Abdomen: Soft, active Extremities: Normal configuration Neurologic: Grossly symmetric and intact.  Studies Reviewed:  Using the nasal endoscope, again no specific mucosal abnormality or lesion.  CT scan of the paranasal sinuses.    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  This surgery may be limited to a  biopsy, or may be amenable to complete removal when I know what I'm dealing with.  I will give you more complete instructions before you go home from the hospital.  For now, I will leave you a  prescription for Vicodin for pain relief and Keflex antibiotic.  Cephalexin 500 MG Oral Capsule;TAKE 1 CAPSULE 4 TIMES DAILY; Qty30; R0; Rx. Hydrocodone-Acetaminophen 5-325 MG Oral Tablet;1-2 po q4-6h prn pain; Qty40; R0; Rx.  Flo ShanksWOLICKI, Deslyn Cavenaugh 12/22/2013, 6:36 PM

## 2013-12-28 LAB — POCT HEMOGLOBIN-HEMACUE: HEMOGLOBIN: 12.7 g/dL (ref 12.0–15.0)

## 2013-12-29 ENCOUNTER — Encounter (HOSPITAL_BASED_OUTPATIENT_CLINIC_OR_DEPARTMENT_OTHER): Payer: Self-pay | Admitting: Otolaryngology

## 2014-02-24 ENCOUNTER — Other Ambulatory Visit: Payer: Self-pay

## 2014-02-24 DIAGNOSIS — Z1231 Encounter for screening mammogram for malignant neoplasm of breast: Secondary | ICD-10-CM

## 2014-03-14 ENCOUNTER — Ambulatory Visit: Payer: BC Managed Care – PPO

## 2014-03-22 ENCOUNTER — Ambulatory Visit
Admission: RE | Admit: 2014-03-22 | Discharge: 2014-03-22 | Disposition: A | Discharge status: Home / Self Care | Payer: BC Managed Care – PPO | Source: Ambulatory Visit

## 2014-03-22 DIAGNOSIS — Z1231 Encounter for screening mammogram for malignant neoplasm of breast: Secondary | ICD-10-CM

## 2014-10-17 ENCOUNTER — Other Ambulatory Visit: Payer: Self-pay | Admitting: Gastroenterology

## 2015-03-18 ENCOUNTER — Ambulatory Visit (INDEPENDENT_AMBULATORY_CARE_PROVIDER_SITE_OTHER): Payer: BLUE CROSS/BLUE SHIELD | Admitting: Physician Assistant

## 2015-03-18 DIAGNOSIS — L255 Unspecified contact dermatitis due to plants, except food: Secondary | ICD-10-CM | POA: Diagnosis not present

## 2015-03-18 DIAGNOSIS — F419 Anxiety disorder, unspecified: Secondary | ICD-10-CM | POA: Insufficient documentation

## 2015-03-18 MED ORDER — PREDNISONE 10 MG PO TABS: ORAL_TABLET | ORAL | Status: AC

## 2015-03-18 NOTE — Progress Notes (Signed)
   Subjective:    Patient ID: Savannah Herring, female    DOB: October 13, 1956, 59 y.o.   MRN: [id number]  HPI Pt presents to clinic with poison ivy that she started getting about 5 days ago.  She has been working in the yard and she is not completely sure what poison ivy looks like but her yard is really woody and full of vines.  She had poison ivy several weeks ago and she did nothing and it lasted about 3 weeks and she does not want to deal with it that again.  She has been using nothing on the rash.  Review of Systems Patient Active Problem List   Diagnosis Date Noted  . Anxiety 03/18/2015   Prior to Admission medications   Medication Sig Start Date End Date Taking? Authorizing Provider  calcium-vitamin D (OSCAL WITH D) 500-200 MG-UNIT per tablet Take 1 tablet by mouth.   Yes Historical Provider, MD  FLUoxetine (PROZAC) 20 MG capsule Take 20 mg by mouth daily.   Yes Historical Provider, MD  Multiple Vitamins-Minerals (MULTIVITAMIN WITH MINERALS) tablet Take 1 tablet by mouth daily.   Yes Historical Provider, MD          Allergies  Allergen Reactions  . Sulfa Antibiotics Hives    Medications, allergies, past medical history, surgical history, family history, social history and problem list reviewed and updated.      Objective:   Physical Exam  Constitutional: She is oriented to person, place, and time. She appears well-developed and well-nourished.  BP 120/74 mmHg  Pulse 74  Temp(Src) 98.9 F (37.2 C)  Resp 16  Ht 5\' 9"  (1.753 m)  Wt 156 lb (70.761 kg)  BMI 23.03 kg/m2  SpO2 99%   HENT:  Head: Normocephalic and atraumatic.  Right Ear: External ear normal.  Left Ear: External ear normal.  Pulmonary/Chest: Effort normal.  Neurological: She is alert and oriented to person, place, and time.  Skin: Skin is warm and dry. Rash (vesicular rash on arms - some areas are confluent into bullae - no surrounding erythema) noted.  Psychiatric: She has a normal mood and affect. Her behavior  is normal. Judgment and thought content normal.       Assessment & Plan:  Rhus dermatitis - Plan: predniSONE (DELTASONE) 10 MG tablet   Ok to use OTC zyrtec and benadryl to help with itching.  Benny LennertSarah Rondrick Barreira PA-C  [facility] 03/18/2015 11:12 AM

## 2015-08-21 ENCOUNTER — Ambulatory Visit (INDEPENDENT_AMBULATORY_CARE_PROVIDER_SITE_OTHER): Payer: BLUE CROSS/BLUE SHIELD | Admitting: Family Medicine

## 2015-08-21 ENCOUNTER — Ambulatory Visit (INDEPENDENT_AMBULATORY_CARE_PROVIDER_SITE_OTHER): Payer: BLUE CROSS/BLUE SHIELD

## 2015-08-21 VITALS — BP 112/84 | HR 53 | Temp 98.5°F | Resp 16 | Ht 67.5 in | Wt 157.2 lb

## 2015-08-21 DIAGNOSIS — R1032 Left lower quadrant pain: Secondary | ICD-10-CM

## 2015-08-21 DIAGNOSIS — R1030 Lower abdominal pain, unspecified: Secondary | ICD-10-CM

## 2015-08-21 LAB — POCT URINALYSIS DIP (MANUAL ENTRY)
Bilirubin, UA: NEGATIVE
Glucose, UA: NEGATIVE
Ketones, POC UA: NEGATIVE
Leukocytes, UA: NEGATIVE
Nitrite, UA: NEGATIVE
Protein Ur, POC: NEGATIVE
Spec Grav, UA: 1.015
Urobilinogen, UA: 0.2
pH, UA: 7.5

## 2015-08-21 LAB — POCT CBC
Granulocyte percent: 69.4 % (ref 37–80)
HCT, POC: 38.4 % (ref 37.7–47.9)
Hemoglobin: 13.1 g/dL (ref 12.2–16.2)
Lymph, poc: 1.4 (ref 0.6–3.4)
MCH, POC: 33.7 pg — AB (ref 27–31.2)
MCHC: 34.2 g/dL (ref 31.8–35.4)
MCV: 98.5 fL — AB (ref 80–97)
MID (cbc): 0.4 (ref 0–0.9)
MPV: 7 fL (ref 0–99.8)
POC Granulocyte: 4 (ref 2–6.9)
POC LYMPH PERCENT: 23.9 % (ref 10–50)
POC MID %: 6.7 % (ref 0–12)
Platelet Count, POC: 308 K/uL (ref 142–424)
RBC: 3.89 M/uL — AB (ref 4.04–5.48)
RDW, POC: 12.8 %
WBC: 5.7 K/uL (ref 4.6–10.2)

## 2015-08-21 LAB — POC MICROSCOPIC URINALYSIS (UMFC): Mucus: ABSENT

## 2015-08-21 NOTE — Patient Instructions (Signed)
The x-ray shows that you have significant stool burden in the right ascending colon down to the ileocecal valve. If you can pick up some MiraLAX and take it twice a day for the next 2-3 days to clean out that part of your belly think you would feel a lot better. If you are getting worse or have new symptoms, please call and we will try to work through this.

## 2015-08-21 NOTE — Progress Notes (Addendum)
Subjective:    Patient ID: Savannah Herring, female    DOB: [birth year], 59 y.o.   MRN: [id number] This chart was scribed for Elvina Sidle, MD by Littie Deeds, Medical Scribe. This patient was seen in Room 2 and the patient's care was started at 2:32 PM.   HPI HPI Comments: Savannah Herring is a 59 y.o. female who presents to the [facility] complaining of gradual onset, progressively worsening, constant abdominal pain that started 3 days ago. The pain was initially intermittent, only occurring with coughing or turning over in bed, but is now a constant pain. She initially thought the pain was due to an exercise class she went to 1 week ago. She does note having some appetite loss today. She does have a cough and postnasal drip which she attributes to allergies. Patient denies fever, back pain, urinary symptoms, and GI symptoms. Her last bowel movement was earlier today.  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  Review of Systems  Constitutional: Positive for appetite change. Negative for fever.  HENT: Positive for postnasal drip.   Respiratory: Positive for cough.   Gastrointestinal: Positive for abdominal pain. Negative for nausea, vomiting, diarrhea and constipation.  Genitourinary: Negative.   Musculoskeletal: Negative for back pain.  Allergic/Immunologic: Positive for environmental allergies.       Objective:   Physical Exam CONSTITUTIONAL: Well developed/well nourished HEAD: Normocephalic/atraumatic EYES: EOM/PERRL ENMT: Mucous membranes moist NECK: supple no meningeal signs SPINE: entire spine nontender CV: S1/S2 noted, no murmurs/rubs/gallops noted LUNGS: Lungs are clear to auscultation bilaterally, no apparent distress ABDOMEN: Tenderness in left lower quadrant and suprapubic regions of abdomen with mild guarding, no rebound. GU: no cva tenderness NEURO: Pt is awake/alert, moves all extremitiesx4 EXTREMITIES: pulses normal, full  ROM SKIN: warm, color normal PSYCH: no abnormalities of mood noted  [facility] reading (PRIMARY) by  Dr. Milus Glazier:  .heavy stoool burden Ascending colon  Results for orders placed or performed in visit on 08/21/15  POCT CBC  Result Value Ref Range   WBC 5.7 4.6 - 10.2 K/uL   Lymph, poc 1.4 0.6 - 3.4   POC LYMPH PERCENT 23.9 10 - 50 %L   MID (cbc) 0.4 0 - 0.9   POC MID % 6.7 0 - 12 %M   POC Granulocyte 4.0 2 - 6.9   Granulocyte percent 69.4 37 - 80 %G   RBC 3.89 (A) 4.04 - 5.48 M/uL   Hemoglobin 13.1 12.2 - 16.2 g/dL   HCT, POC 40.9 81.1 - 47.9 %   MCV 98.5 (A) 80 - 97 fL   MCH, POC 33.7 (A) 27 - 31.2 pg   MCHC 34.2 31.8 - 35.4 g/dL   RDW, POC 91.4 %   Platelet Count, POC 308 142 - 424 K/uL   MPV 7.0 0 - 99.8 fL  POCT urinalysis dipstick  Result Value Ref Range   Color, UA yellow yellow   Clarity, UA clear clear   Glucose, UA negative negative   Bilirubin, UA negative negative   Ketones, POC UA negative negative   Spec Grav, UA 1.015    Blood, UA small (A) negative   pH, UA 7.5    Protein Ur, POC negative negative   Urobilinogen, UA 0.2    Nitrite, UA Negative Negative   Leukocytes, UA Negative Negative  POCT Microscopic Urinalysis ([facility])  Result Value Ref Range   WBC,UR,HPF,POC None None WBC/hpf   RBC,UR,HPF,POC Few (A) None RBC/hpf  Bacteria None None, Too numerous to count   Mucus Absent Absent   Epithelial Cells, UR Per Microscopy None None, Too numerous to count cells/hpf        Assessment & Plan:   By signing my name below, I, Littie Deedsichard Sun, attest that this documentation has been prepared under the direction and in the presence of Elvina SidleKurt Anishka Bushard, MD.  Electronically Signed: Littie Deedsichard Sun, Medical Scribe. 08/21/2015. 2:32 PM. This chart was scribed in my presence and reviewed by me personally.    ICD-9-CM ICD-10-CM   1. Abdominal pain, left lower quadrant 789.04 R10.32 POCT CBC     POCT urinalysis dipstick     POCT Microscopic Urinalysis ([facility])     DG Abd  Acute W/Chest  2. Suprapubic abdominal pain, unspecified laterality 789.09 R10.30    The x-ray, and, nation with normal labs, strongly suggest that you have obstipation and that taking Celexa for a few days will clear the symptoms.  Signed, Elvina SidleKurt Constance Whittle, MD

## 2016-01-31 DIAGNOSIS — J209 Acute bronchitis, unspecified: Secondary | ICD-10-CM | POA: Diagnosis not present

## 2016-01-31 DIAGNOSIS — K219 Gastro-esophageal reflux disease without esophagitis: Secondary | ICD-10-CM | POA: Diagnosis not present

## 2016-02-13 DIAGNOSIS — L918 Other hypertrophic disorders of the skin: Secondary | ICD-10-CM | POA: Diagnosis not present

## 2016-02-13 DIAGNOSIS — Z85828 Personal history of other malignant neoplasm of skin: Secondary | ICD-10-CM | POA: Diagnosis not present

## 2016-02-13 DIAGNOSIS — D485 Neoplasm of uncertain behavior of skin: Secondary | ICD-10-CM | POA: Diagnosis not present

## 2016-02-13 DIAGNOSIS — L821 Other seborrheic keratosis: Secondary | ICD-10-CM | POA: Diagnosis not present

## 2016-02-13 DIAGNOSIS — D0339 Melanoma in situ of other parts of face: Secondary | ICD-10-CM | POA: Diagnosis not present

## 2016-02-13 DIAGNOSIS — D1801 Hemangioma of skin and subcutaneous tissue: Secondary | ICD-10-CM | POA: Diagnosis not present

## 2016-02-14 DIAGNOSIS — F4323 Adjustment disorder with mixed anxiety and depressed mood: Secondary | ICD-10-CM | POA: Diagnosis not present

## 2016-02-26 DIAGNOSIS — L089 Local infection of the skin and subcutaneous tissue, unspecified: Secondary | ICD-10-CM | POA: Diagnosis not present

## 2016-02-26 DIAGNOSIS — D485 Neoplasm of uncertain behavior of skin: Secondary | ICD-10-CM | POA: Diagnosis not present

## 2016-03-05 DIAGNOSIS — F4323 Adjustment disorder with mixed anxiety and depressed mood: Secondary | ICD-10-CM | POA: Diagnosis not present

## 2016-04-03 ENCOUNTER — Other Ambulatory Visit: Payer: Self-pay | Admitting: Family Medicine

## 2016-04-03 DIAGNOSIS — Z1231 Encounter for screening mammogram for malignant neoplasm of breast: Secondary | ICD-10-CM

## 2016-04-17 DIAGNOSIS — L259 Unspecified contact dermatitis, unspecified cause: Secondary | ICD-10-CM | POA: Diagnosis not present

## 2016-05-02 ENCOUNTER — Ambulatory Visit
Admission: RE | Admit: 2016-05-02 | Discharge: 2016-05-02 | Disposition: A | Discharge status: Home / Self Care | Payer: BLUE CROSS/BLUE SHIELD | Source: Ambulatory Visit | Attending: Family Medicine | Admitting: Family Medicine

## 2016-05-02 DIAGNOSIS — Z1231 Encounter for screening mammogram for malignant neoplasm of breast: Secondary | ICD-10-CM | POA: Diagnosis not present

## 2016-06-10 ENCOUNTER — Ambulatory Visit (INDEPENDENT_AMBULATORY_CARE_PROVIDER_SITE_OTHER): Payer: BLUE CROSS/BLUE SHIELD | Admitting: Family Medicine

## 2016-06-10 VITALS — BP 118/84 | HR 65 | Temp 98.1°F | Resp 17 | Ht 67.5 in | Wt 159.0 lb

## 2016-06-10 DIAGNOSIS — J029 Acute pharyngitis, unspecified: Secondary | ICD-10-CM

## 2016-06-10 DIAGNOSIS — R599 Enlarged lymph nodes, unspecified: Secondary | ICD-10-CM

## 2016-06-10 DIAGNOSIS — R590 Localized enlarged lymph nodes: Secondary | ICD-10-CM

## 2016-06-10 LAB — POCT RAPID STREP A (OFFICE): RAPID STREP A SCREEN: NEGATIVE

## 2016-06-10 NOTE — Patient Instructions (Addendum)
IF you received an x-ray today, you will receive an invoice from [facility]. Please contact [facility] at [phone] with questions or concerns regarding your invoice.   IF you received labwork today, you will receive an invoice from [facility]. Please contact [facility] at [phone] with questions or concerns regarding your invoice.   Our billing staff will not be able to assist you with questions regarding bills from these companies.  You will be contacted with the lab results as soon as they are available. The fastest way to get your results is to activate your My Chart account. Instructions are located on the last page of this paperwork. If you have not heard from us regarding the results in 2 weeks, please contact this office.  Lymphadenopathy Lymphadenopathy refers to swollen or enlarged lymph glands, also called lymph nodes. Lymph glands are part of your body's defense (immune) system, which protects the body from infections, germs, and diseases. Lymph glands are found in many locations in your body, including the neck, underarm, and groin.  Many things can cause lymph glands to become enlarged. When your immune system responds to germs, such as viruses or bacteria, infection-fighting cells and fluid build up. This causes the glands to grow in size. Usually, this is not something to worry about. The swelling and any soreness often go away without treatment. However, swollen lymph glands can also be caused by a number of diseases. Your health care provider may do various tests to help determine the cause. If the cause of your swollen lymph glands cannot be found, it is important to monitor your condition to make sure the swelling goes away. HOME CARE INSTRUCTIONS Watch your condition for any changes. The following actions may help to lessen any discomfort you are feeling:  Get plenty of rest.  Take medicines only as directed by your health care  provider. Your health care provider may recommend over-the-counter medicines for pain.  Apply moist heat compresses to the site of swollen lymph nodes as directed by your health care provider. This can help reduce any pain.  Check your lymph nodes daily for any changes.  Keep all follow-up visits as directed by your health care provider. This is important. SEEK MEDICAL CARE IF:  Your lymph nodes are still swollen after 2 weeks.  Your swelling increases or spreads to other areas.  Your lymph nodes are hard, seem fixed to the skin, or are growing rapidly.  Your skin over the lymph nodes is red and inflamed.  You have a fever.  You have chills.  You have fatigue.  You develop a sore throat.  You have abdominal pain.  You have weight loss.  You have night sweats. SEEK IMMEDIATE MEDICAL CARE IF:  You notice fluid leaking from the area of the enlarged lymph node.  You have severe pain in any area of your body.  You have chest pain.  You have shortness of breath.   This information is not intended to replace advice given to you by your health care provider. Make sure you discuss any questions you have with your health care provider.   Document Released: 07/23/2008 Document Revised: 11/04/2014 Document Reviewed: 05/19/2014 Elsevier Interactive Patient Education 2016 Elsevier Inc.  

## 2016-06-12 LAB — CULTURE, GROUP A STREP: Organism ID, Bacteria: NORMAL

## 2016-06-17 NOTE — Progress Notes (Signed)
Subjective:  By signing my name below, I, Stann Oresung-Kai Tsai, attest that this documentation has been prepared under the direction and in the presence of Norberto SorensonEva Shaw, MD. Electronically Signed: Stann Oresung-Kai Tsai, Scribe. 06/17/2016 , 5:34 PM .  Patient was seen in Room 12 .   Patient ID: Savannah Herring, female    DOB: 1956/04/03, 60 y.o.   MRN: [id number] Chief Complaint  Patient presents with  . Sore Throat    x2days   Sore Throat   Associated symptoms include trouble swallowing. Pertinent negatives include no congestion, coughing, diarrhea, shortness of breath or vomiting.   Savannah Herring is a 60 y.o. female who [facility] complaining of sore throat that started 2 days ago. Patient noticed a lump In her right side of her neck with some pain when swallowing. She felt warm but did not measure a fever. She's taken tylenol without much relief. She denies chills, nasal congestion, sinus pressure, dental problems or cough. She saw her dentist recently without any abnormalities.   She has possible sick contact: her daughter, who delivered a child 2 days ago, had strep B.   Past Medical History:  Diagnosis Date  . Anxiety   . Depression   . Medical history non-contributory   . Wears glasses    Prior to Admission medications   Medication Sig Start Date End Date Taking? Authorizing Provider  calcium-vitamin D (OSCAL WITH D) 500-200 MG-UNIT per tablet Take 1 tablet by mouth.   Yes Historical Provider, MD  FLUoxetine (PROZAC) 20 MG capsule Take 20 mg by mouth daily.   Yes Historical Provider, MD  Multiple Vitamins-Minerals (MULTIVITAMIN WITH MINERALS) tablet Take 1 tablet by mouth daily.   Yes Historical Provider, MD   Allergies  Allergen Reactions  . Sulfa Antibiotics Hives   Review of Systems  Constitutional: Positive for fatigue and fever. Negative for chills.  HENT: Positive for sore throat and trouble swallowing. Negative for congestion, dental problem and sinus pressure.   Respiratory:  Negative for cough, shortness of breath and wheezing.   Gastrointestinal: Negative for diarrhea, nausea and vomiting.  Hematological: Positive for adenopathy.       Objective:   Physical Exam  Constitutional: She is oriented to person, place, and time. She appears well-developed and well-nourished. No distress.  HENT:  Head: Normocephalic and atraumatic.  Right Ear: Tympanic membrane normal.  Left Ear: Tympanic membrane normal.  Nose: Nose normal.  Mouth/Throat: Posterior oropharyngeal erythema present. No oropharyngeal exudate or posterior oropharyngeal edema.  Eyes: EOM are normal. Pupils are equal, round, and reactive to light.  Neck: Neck supple. No thyromegaly present.  Cardiovascular: Normal rate.   Pulmonary/Chest: Effort normal. No respiratory distress.  Musculoskeletal: Normal range of motion.  Lymphadenopathy:       Head (right side): Submandibular and tonsillar adenopathy present.       Head (left side): Submandibular and tonsillar adenopathy present.  Adenopathy right worse than left  Neurological: She is alert and oriented to person, place, and time.  Skin: Skin is warm and dry.  Psychiatric: She has a normal mood and affect. Her behavior is normal.  Nursing note and vitals reviewed.   BP 118/84 (BP Location: Right Arm, Patient Position: Sitting, Cuff Size: Normal)   Pulse 65   Temp 98.1 F (36.7 C) (Oral)   Resp 17   Ht 5' 7.5" (1.715 m)   Wt 159 lb (72.1 kg)   SpO2 98%   BMI 24.54 kg/m    Results for  orders placed or performed in visit on 06/10/16  Culture, Group A Strep  Result Value Ref Range   Organism ID, Bacteria Normal Upper Respiratory Flora    Organism ID, Bacteria No Beta Hemolytic Streptococci Isolated   POCT rapid strep A  Result Value Ref Range   Rapid Strep A Screen Negative Negative       Assessment & Plan:   1. Acute pharyngitis, unspecified etiology   2. Anterior cervical adenopathy     Orders Placed This Encounter  Procedures    . Culture, Group A Strep    Order Specific Question:   Source    Answer:   oropharynx  . POCT rapid strep A    I personally performed the services described in this documentation, which was scribed in my presence. The recorded information has been reviewed and considered, and addended by me as needed.   Norberto SorensonEva Shaw, M.D.  [facility]  [place] [address] [place], [place] [address] [phone] phone [phone] fax  06/17/16 10:18 PM

## 2016-08-12 DIAGNOSIS — R070 Pain in throat: Secondary | ICD-10-CM | POA: Diagnosis not present

## 2016-08-12 DIAGNOSIS — J358 Other chronic diseases of tonsils and adenoids: Secondary | ICD-10-CM | POA: Diagnosis not present

## 2016-08-28 DIAGNOSIS — Z0000 Encounter for general adult medical examination without abnormal findings: Secondary | ICD-10-CM | POA: Diagnosis not present

## 2016-08-28 DIAGNOSIS — K219 Gastro-esophageal reflux disease without esophagitis: Secondary | ICD-10-CM | POA: Diagnosis not present

## 2016-08-28 DIAGNOSIS — G43009 Migraine without aura, not intractable, without status migrainosus: Secondary | ICD-10-CM | POA: Diagnosis not present

## 2016-08-28 DIAGNOSIS — M150 Primary generalized (osteo)arthritis: Secondary | ICD-10-CM | POA: Diagnosis not present

## 2016-11-27 DIAGNOSIS — H02831 Dermatochalasis of right upper eyelid: Secondary | ICD-10-CM | POA: Diagnosis not present

## 2016-12-05 DIAGNOSIS — R300 Dysuria: Secondary | ICD-10-CM | POA: Diagnosis not present

## 2017-02-13 DIAGNOSIS — R05 Cough: Secondary | ICD-10-CM | POA: Diagnosis not present

## 2017-02-13 DIAGNOSIS — R0789 Other chest pain: Secondary | ICD-10-CM | POA: Diagnosis not present

## 2017-03-14 ENCOUNTER — Ambulatory Visit: Payer: BLUE CROSS/BLUE SHIELD

## 2017-03-19 ENCOUNTER — Ambulatory Visit: Payer: BLUE CROSS/BLUE SHIELD

## 2017-03-19 VITALS — Temp 97.7°F

## 2017-03-19 DIAGNOSIS — Z23 Encounter for immunization: Secondary | ICD-10-CM

## 2017-04-18 DIAGNOSIS — L905 Scar conditions and fibrosis of skin: Secondary | ICD-10-CM | POA: Diagnosis not present

## 2017-04-18 DIAGNOSIS — L738 Other specified follicular disorders: Secondary | ICD-10-CM | POA: Diagnosis not present

## 2017-04-18 DIAGNOSIS — Z85828 Personal history of other malignant neoplasm of skin: Secondary | ICD-10-CM | POA: Diagnosis not present

## 2017-04-18 DIAGNOSIS — L821 Other seborrheic keratosis: Secondary | ICD-10-CM | POA: Diagnosis not present

## 2017-05-29 DIAGNOSIS — R300 Dysuria: Secondary | ICD-10-CM | POA: Diagnosis not present

## 2017-06-10 DIAGNOSIS — R350 Frequency of micturition: Secondary | ICD-10-CM | POA: Diagnosis not present

## 2017-06-10 DIAGNOSIS — N760 Acute vaginitis: Secondary | ICD-10-CM | POA: Diagnosis not present

## 2017-08-29 ENCOUNTER — Other Ambulatory Visit (HOSPITAL_COMMUNITY)
Admission: RE | Admit: 2017-08-29 | Discharge: 2017-08-29 | Disposition: A | Discharge status: Home / Self Care | Payer: BLUE CROSS/BLUE SHIELD | Source: Ambulatory Visit | Attending: Family Medicine | Admitting: Family Medicine

## 2017-08-29 ENCOUNTER — Other Ambulatory Visit: Payer: Self-pay | Admitting: Family Medicine

## 2017-08-29 DIAGNOSIS — Z01419 Encounter for gynecological examination (general) (routine) without abnormal findings: Secondary | ICD-10-CM | POA: Diagnosis not present

## 2017-08-29 DIAGNOSIS — Z0000 Encounter for general adult medical examination without abnormal findings: Secondary | ICD-10-CM | POA: Diagnosis not present

## 2017-09-02 LAB — CYTOLOGY - PAP: DIAGNOSIS: NEGATIVE

## 2017-09-09 DIAGNOSIS — Z136 Encounter for screening for cardiovascular disorders: Secondary | ICD-10-CM | POA: Diagnosis not present

## 2017-09-09 DIAGNOSIS — Z131 Encounter for screening for diabetes mellitus: Secondary | ICD-10-CM | POA: Diagnosis not present

## 2017-09-26 ENCOUNTER — Ambulatory Visit: Payer: BLUE CROSS/BLUE SHIELD

## 2017-09-30 ENCOUNTER — Ambulatory Visit: Payer: BLUE CROSS/BLUE SHIELD

## 2017-09-30 DIAGNOSIS — Z789 Other specified health status: Secondary | ICD-10-CM

## 2017-12-11 ENCOUNTER — Other Ambulatory Visit: Payer: Self-pay | Admitting: Family Medicine

## 2017-12-11 DIAGNOSIS — Z1231 Encounter for screening mammogram for malignant neoplasm of breast: Secondary | ICD-10-CM

## 2018-01-05 ENCOUNTER — Ambulatory Visit
Admission: RE | Admit: 2018-01-05 | Discharge: 2018-01-05 | Disposition: A | Discharge status: Home / Self Care | Payer: BLUE CROSS/BLUE SHIELD | Source: Ambulatory Visit | Attending: Family Medicine | Admitting: Family Medicine

## 2018-01-05 DIAGNOSIS — Z1231 Encounter for screening mammogram for malignant neoplasm of breast: Secondary | ICD-10-CM | POA: Diagnosis not present

## 2018-04-06 DIAGNOSIS — B349 Viral infection, unspecified: Secondary | ICD-10-CM | POA: Diagnosis not present

## 2018-04-10 DIAGNOSIS — J019 Acute sinusitis, unspecified: Secondary | ICD-10-CM | POA: Diagnosis not present

## 2018-04-28 DIAGNOSIS — R05 Cough: Secondary | ICD-10-CM | POA: Diagnosis not present

## 2018-05-13 ENCOUNTER — Ambulatory Visit
Admission: RE | Admit: 2018-05-13 | Discharge: 2018-05-13 | Disposition: A | Discharge status: Home / Self Care | Payer: BLUE CROSS/BLUE SHIELD | Source: Ambulatory Visit | Attending: Family Medicine | Admitting: Family Medicine

## 2018-05-13 ENCOUNTER — Other Ambulatory Visit: Payer: Self-pay | Admitting: Family Medicine

## 2018-05-13 DIAGNOSIS — R059 Cough, unspecified: Secondary | ICD-10-CM

## 2018-05-13 DIAGNOSIS — R05 Cough: Secondary | ICD-10-CM | POA: Diagnosis not present

## 2018-06-22 DIAGNOSIS — D2261 Melanocytic nevi of right upper limb, including shoulder: Secondary | ICD-10-CM | POA: Diagnosis not present

## 2018-06-22 DIAGNOSIS — Z85828 Personal history of other malignant neoplasm of skin: Secondary | ICD-10-CM | POA: Diagnosis not present

## 2018-06-22 DIAGNOSIS — D1801 Hemangioma of skin and subcutaneous tissue: Secondary | ICD-10-CM | POA: Diagnosis not present

## 2018-06-22 DIAGNOSIS — L821 Other seborrheic keratosis: Secondary | ICD-10-CM | POA: Diagnosis not present

## 2018-07-21 DIAGNOSIS — Z86010 Personal history of colonic polyps: Secondary | ICD-10-CM | POA: Diagnosis not present

## 2018-07-21 DIAGNOSIS — D122 Benign neoplasm of ascending colon: Secondary | ICD-10-CM | POA: Diagnosis not present

## 2018-07-24 DIAGNOSIS — D122 Benign neoplasm of ascending colon: Secondary | ICD-10-CM | POA: Diagnosis not present

## 2018-08-11 ENCOUNTER — Ambulatory Visit
Admission: RE | Admit: 2018-08-11 | Discharge: 2018-08-11 | Disposition: A | Discharge status: Home / Self Care | Payer: BLUE CROSS/BLUE SHIELD | Source: Ambulatory Visit | Attending: Physician Assistant | Admitting: Physician Assistant

## 2018-08-11 ENCOUNTER — Other Ambulatory Visit: Payer: Self-pay | Admitting: Physician Assistant

## 2018-08-11 DIAGNOSIS — R059 Cough, unspecified: Secondary | ICD-10-CM

## 2018-08-11 DIAGNOSIS — R079 Chest pain, unspecified: Secondary | ICD-10-CM | POA: Diagnosis not present

## 2018-08-11 DIAGNOSIS — R0789 Other chest pain: Secondary | ICD-10-CM

## 2018-08-11 DIAGNOSIS — R05 Cough: Secondary | ICD-10-CM

## 2018-09-28 DIAGNOSIS — D485 Neoplasm of uncertain behavior of skin: Secondary | ICD-10-CM | POA: Diagnosis not present

## 2018-09-28 DIAGNOSIS — L820 Inflamed seborrheic keratosis: Secondary | ICD-10-CM | POA: Diagnosis not present

## 2018-10-20 DIAGNOSIS — J029 Acute pharyngitis, unspecified: Secondary | ICD-10-CM | POA: Diagnosis not present

## 2018-11-29 DIAGNOSIS — N390 Urinary tract infection, site not specified: Secondary | ICD-10-CM | POA: Diagnosis not present

## 2018-12-22 DIAGNOSIS — K219 Gastro-esophageal reflux disease without esophagitis: Secondary | ICD-10-CM | POA: Diagnosis not present

## 2018-12-22 DIAGNOSIS — Z131 Encounter for screening for diabetes mellitus: Secondary | ICD-10-CM | POA: Diagnosis not present

## 2018-12-22 DIAGNOSIS — N3090 Cystitis, unspecified without hematuria: Secondary | ICD-10-CM | POA: Diagnosis not present

## 2018-12-22 DIAGNOSIS — Z13220 Encounter for screening for lipoid disorders: Secondary | ICD-10-CM | POA: Diagnosis not present

## 2018-12-22 DIAGNOSIS — Z0000 Encounter for general adult medical examination without abnormal findings: Secondary | ICD-10-CM | POA: Diagnosis not present

## 2018-12-22 DIAGNOSIS — R05 Cough: Secondary | ICD-10-CM | POA: Diagnosis not present

## 2018-12-22 DIAGNOSIS — G43009 Migraine without aura, not intractable, without status migrainosus: Secondary | ICD-10-CM | POA: Diagnosis not present

## 2018-12-23 ENCOUNTER — Other Ambulatory Visit: Payer: Self-pay | Admitting: Family Medicine

## 2018-12-23 ENCOUNTER — Ambulatory Visit
Admission: RE | Admit: 2018-12-23 | Discharge: 2018-12-23 | Disposition: A | Discharge status: Home / Self Care | Payer: BLUE CROSS/BLUE SHIELD | Source: Ambulatory Visit | Attending: Family Medicine | Admitting: Family Medicine

## 2018-12-23 DIAGNOSIS — R0789 Other chest pain: Secondary | ICD-10-CM

## 2018-12-23 DIAGNOSIS — R0781 Pleurodynia: Secondary | ICD-10-CM | POA: Diagnosis not present

## 2018-12-23 DIAGNOSIS — R05 Cough: Secondary | ICD-10-CM

## 2018-12-23 DIAGNOSIS — R053 Chronic cough: Secondary | ICD-10-CM

## 2018-12-23 DIAGNOSIS — Z23 Encounter for immunization: Secondary | ICD-10-CM | POA: Diagnosis not present

## 2018-12-23 DIAGNOSIS — S299XXA Unspecified injury of thorax, initial encounter: Secondary | ICD-10-CM | POA: Diagnosis not present

## 2018-12-28 ENCOUNTER — Other Ambulatory Visit: Payer: Self-pay | Admitting: Family Medicine

## 2018-12-28 DIAGNOSIS — E2839 Other primary ovarian failure: Secondary | ICD-10-CM

## 2018-12-29 ENCOUNTER — Other Ambulatory Visit: Payer: Self-pay | Admitting: Family Medicine

## 2018-12-29 DIAGNOSIS — Z1231 Encounter for screening mammogram for malignant neoplasm of breast: Secondary | ICD-10-CM

## 2019-02-22 DIAGNOSIS — Z23 Encounter for immunization: Secondary | ICD-10-CM | POA: Diagnosis not present

## 2019-03-04 ENCOUNTER — Ambulatory Visit: Payer: BLUE CROSS/BLUE SHIELD

## 2019-03-04 ENCOUNTER — Other Ambulatory Visit: Payer: BLUE CROSS/BLUE SHIELD

## 2019-04-19 DIAGNOSIS — M25532 Pain in left wrist: Secondary | ICD-10-CM | POA: Diagnosis not present

## 2019-04-25 DIAGNOSIS — Z1159 Encounter for screening for other viral diseases: Secondary | ICD-10-CM | POA: Diagnosis not present

## 2019-05-05 DIAGNOSIS — L98491 Non-pressure chronic ulcer of skin of other sites limited to breakdown of skin: Secondary | ICD-10-CM | POA: Diagnosis not present

## 2019-05-05 DIAGNOSIS — L570 Actinic keratosis: Secondary | ICD-10-CM | POA: Diagnosis not present

## 2019-05-18 ENCOUNTER — Ambulatory Visit
Admission: RE | Admit: 2019-05-18 | Discharge: 2019-05-18 | Disposition: A | Discharge status: Home / Self Care | Payer: BLUE CROSS/BLUE SHIELD | Source: Ambulatory Visit | Attending: Family Medicine | Admitting: Family Medicine

## 2019-05-18 ENCOUNTER — Other Ambulatory Visit: Payer: Self-pay

## 2019-05-18 DIAGNOSIS — Z1231 Encounter for screening mammogram for malignant neoplasm of breast: Secondary | ICD-10-CM | POA: Diagnosis not present

## 2019-05-18 DIAGNOSIS — M85852 Other specified disorders of bone density and structure, left thigh: Secondary | ICD-10-CM | POA: Diagnosis not present

## 2019-05-18 DIAGNOSIS — Z780 Asymptomatic menopausal state: Secondary | ICD-10-CM | POA: Diagnosis not present

## 2019-05-18 DIAGNOSIS — E2839 Other primary ovarian failure: Secondary | ICD-10-CM

## 2019-07-21 DIAGNOSIS — Z85828 Personal history of other malignant neoplasm of skin: Secondary | ICD-10-CM | POA: Diagnosis not present

## 2019-07-21 DIAGNOSIS — L821 Other seborrheic keratosis: Secondary | ICD-10-CM | POA: Diagnosis not present

## 2019-07-21 DIAGNOSIS — D2262 Melanocytic nevi of left upper limb, including shoulder: Secondary | ICD-10-CM | POA: Diagnosis not present

## 2019-07-21 DIAGNOSIS — D225 Melanocytic nevi of trunk: Secondary | ICD-10-CM | POA: Diagnosis not present

## 2019-07-21 DIAGNOSIS — C44519 Basal cell carcinoma of skin of other part of trunk: Secondary | ICD-10-CM | POA: Diagnosis not present

## 2019-10-12 DIAGNOSIS — L570 Actinic keratosis: Secondary | ICD-10-CM | POA: Diagnosis not present

## 2019-10-18 DIAGNOSIS — Z03818 Encounter for observation for suspected exposure to other biological agents ruled out: Secondary | ICD-10-CM | POA: Diagnosis not present

## 2020-01-25 DIAGNOSIS — Z0000 Encounter for general adult medical examination without abnormal findings: Secondary | ICD-10-CM | POA: Diagnosis not present

## 2020-01-25 DIAGNOSIS — R350 Frequency of micturition: Secondary | ICD-10-CM | POA: Diagnosis not present

## 2020-01-25 DIAGNOSIS — Z13220 Encounter for screening for lipoid disorders: Secondary | ICD-10-CM | POA: Diagnosis not present

## 2020-01-25 DIAGNOSIS — K219 Gastro-esophageal reflux disease without esophagitis: Secondary | ICD-10-CM | POA: Diagnosis not present

## 2020-01-25 DIAGNOSIS — F39 Unspecified mood [affective] disorder: Secondary | ICD-10-CM | POA: Diagnosis not present

## 2020-03-16 DIAGNOSIS — Z85828 Personal history of other malignant neoplasm of skin: Secondary | ICD-10-CM | POA: Diagnosis not present

## 2020-03-16 DIAGNOSIS — D234 Other benign neoplasm of skin of scalp and neck: Secondary | ICD-10-CM | POA: Diagnosis not present

## 2020-06-09 ENCOUNTER — Other Ambulatory Visit: Payer: Self-pay

## 2020-06-09 ENCOUNTER — Ambulatory Visit: Payer: Self-pay | Admitting: Medical

## 2020-06-09 ENCOUNTER — Telehealth: Payer: Self-pay | Admitting: Medical

## 2020-06-09 ENCOUNTER — Encounter: Payer: Self-pay | Admitting: Medical

## 2020-06-09 DIAGNOSIS — J3489 Other specified disorders of nose and nasal sinuses: Secondary | ICD-10-CM

## 2020-06-09 DIAGNOSIS — R197 Diarrhea, unspecified: Secondary | ICD-10-CM

## 2020-06-09 DIAGNOSIS — R059 Cough, unspecified: Secondary | ICD-10-CM

## 2020-06-09 DIAGNOSIS — R0981 Nasal congestion: Secondary | ICD-10-CM

## 2020-06-09 DIAGNOSIS — R05 Cough: Secondary | ICD-10-CM

## 2020-06-09 DIAGNOSIS — R0982 Postnasal drip: Secondary | ICD-10-CM

## 2020-06-09 LAB — POC COVID19 BINAXNOW: SARS Coronavirus 2 Ag: NEGATIVE

## 2020-06-09 MED ORDER — AZITHROMYCIN 250 MG PO TABS: ORAL_TABLET | ORAL | 0 refills | Status: DC

## 2020-06-09 NOTE — Progress Notes (Addendum)
° °  Subjective:    Patient ID: Savannah Herring, female    DOB: Sep 04, 1956, 64 y.o.   MRN: [id number]  HPI  64 yo female in non acute distress started with  Head congestion and post nasal drip ", a lot of drainage, and cough"  X 5 days. Has been on vacation since Tuesday 06/06/2020. Fatigued yesterday and today.   Covid-19 Vaccine > 89months ago Water [employer].  Allergies  Allergen Reactions   Sulfa Antibiotics Hives    Review of Systems  Constitutional: Positive for fatigue and fever ("I have felt like i have had a fever on/off"). Negative for chills.  HENT: Positive for postnasal drip and rhinorrhea. Negative for ear pain.   Respiratory: Positive for cough (little bit unknown color) and shortness of breath (a little bit). Negative for wheezing and stridor.   Cardiovascular: Positive for chest pain (hsitory  of anxiety which she thinks this maybe.  Does not change  with sitting or activity, per patients recall.).  Gastrointestinal: Positive for diarrhea (a little bit the past 2 days). Negative for abdominal pain, nausea and vomiting.  Musculoskeletal: Negative for myalgias.  Skin: Negative for rash.  Neurological: Negative for dizziness, tremors, seizures, syncope, weakness, light-headedness and headaches.    Feels like she has a URI, she states she does get these quite often.       Objective:   Physical Exam    No physical performed due to telemedicine appointment.  AXOX3, cough noted on call  Recent Results (from the past 2160 hour(s))  POC COVID-19     Status: Normal   Collection Time: 06/09/20 11:57 AM  Result Value Ref Range   SARS Coronavirus 2 Ag Negative Negative    Assessment & Plan:  Head congestion, cough , diarrhea POCT Covid-19 negative Covid-19 PCR pending.She has MyChart so she should be able to see result. Will cover patient for Respiratory infection with Z-Pak for potential infection or possibly secondary infection, she is agreeable to this plan. Reviewed with  patient if symptoms worsen, Wheezing/shortness of breath, Chest Pain, or  Fever she should see out medical evaluation/treatment at an urgent care or the Emergency Department  . In the past she has had to have an inhaler. She declines an inhaler today. Rest, increase fluids, OTC Motrin or Tylenol per package instructions.  Patient verbalizes understanding and has no questions at the end of our conversation.

## 2020-06-11 LAB — SARS-COV-2, NAA 2 DAY TAT

## 2020-06-11 LAB — NOVEL CORONAVIRUS, NAA: SARS-CoV-2, NAA: NOT DETECTED

## 2020-07-25 DIAGNOSIS — L738 Other specified follicular disorders: Secondary | ICD-10-CM | POA: Diagnosis not present

## 2020-07-25 DIAGNOSIS — L814 Other melanin hyperpigmentation: Secondary | ICD-10-CM | POA: Diagnosis not present

## 2020-07-25 DIAGNOSIS — L570 Actinic keratosis: Secondary | ICD-10-CM | POA: Diagnosis not present

## 2020-07-25 DIAGNOSIS — L720 Epidermal cyst: Secondary | ICD-10-CM | POA: Diagnosis not present

## 2020-07-25 DIAGNOSIS — Z85828 Personal history of other malignant neoplasm of skin: Secondary | ICD-10-CM | POA: Diagnosis not present

## 2020-07-28 ENCOUNTER — Other Ambulatory Visit: Payer: Self-pay | Admitting: Family Medicine

## 2020-07-28 DIAGNOSIS — Z1231 Encounter for screening mammogram for malignant neoplasm of breast: Secondary | ICD-10-CM

## 2020-07-31 ENCOUNTER — Ambulatory Visit
Admission: RE | Admit: 2020-07-31 | Discharge: 2020-07-31 | Disposition: A | Discharge status: Home / Self Care | Payer: BC Managed Care – PPO | Source: Ambulatory Visit | Attending: Family Medicine | Admitting: Family Medicine

## 2020-07-31 ENCOUNTER — Other Ambulatory Visit: Payer: Self-pay

## 2020-07-31 DIAGNOSIS — Z1231 Encounter for screening mammogram for malignant neoplasm of breast: Secondary | ICD-10-CM | POA: Diagnosis not present

## 2020-10-04 DIAGNOSIS — R000 Tachycardia, unspecified: Secondary | ICD-10-CM | POA: Diagnosis not present

## 2020-10-11 ENCOUNTER — Other Ambulatory Visit: Payer: Self-pay | Admitting: Physician Assistant

## 2020-10-11 ENCOUNTER — Ambulatory Visit: Payer: BC Managed Care – PPO

## 2020-10-11 ENCOUNTER — Other Ambulatory Visit: Payer: Self-pay | Admitting: *Deleted

## 2020-10-11 DIAGNOSIS — R000 Tachycardia, unspecified: Secondary | ICD-10-CM

## 2020-10-26 DIAGNOSIS — M545 Low back pain, unspecified: Secondary | ICD-10-CM | POA: Diagnosis not present

## 2020-11-09 ENCOUNTER — Other Ambulatory Visit: Payer: Self-pay | Admitting: Family Medicine

## 2020-11-09 DIAGNOSIS — M545 Low back pain, unspecified: Secondary | ICD-10-CM | POA: Diagnosis not present

## 2020-11-09 DIAGNOSIS — R1031 Right lower quadrant pain: Secondary | ICD-10-CM | POA: Diagnosis not present

## 2020-11-09 DIAGNOSIS — R3129 Other microscopic hematuria: Secondary | ICD-10-CM | POA: Diagnosis not present

## 2020-11-09 DIAGNOSIS — R109 Unspecified abdominal pain: Secondary | ICD-10-CM

## 2020-11-10 ENCOUNTER — Ambulatory Visit
Admission: RE | Admit: 2020-11-10 | Discharge: 2020-11-10 | Disposition: A | Discharge status: Home / Self Care | Payer: BC Managed Care – PPO | Source: Ambulatory Visit | Attending: Family Medicine | Admitting: Family Medicine

## 2020-11-10 DIAGNOSIS — R109 Unspecified abdominal pain: Secondary | ICD-10-CM | POA: Diagnosis not present

## 2020-11-22 ENCOUNTER — Ambulatory Visit
Admission: RE | Admit: 2020-11-22 | Discharge: 2020-11-22 | Disposition: A | Discharge status: Home / Self Care | Payer: BC Managed Care – PPO | Source: Ambulatory Visit | Attending: Family Medicine | Admitting: Family Medicine

## 2020-11-22 DIAGNOSIS — Z780 Asymptomatic menopausal state: Secondary | ICD-10-CM | POA: Diagnosis not present

## 2020-11-22 DIAGNOSIS — R1031 Right lower quadrant pain: Secondary | ICD-10-CM

## 2020-11-29 DIAGNOSIS — R102 Pelvic and perineal pain: Secondary | ICD-10-CM | POA: Diagnosis not present

## 2020-12-01 ENCOUNTER — Other Ambulatory Visit: Payer: Self-pay | Admitting: Family Medicine

## 2020-12-01 DIAGNOSIS — R1031 Right lower quadrant pain: Secondary | ICD-10-CM

## 2020-12-04 ENCOUNTER — Ambulatory Visit
Admission: RE | Admit: 2020-12-04 | Discharge: 2020-12-04 | Disposition: A | Discharge status: Home / Self Care | Payer: BC Managed Care – PPO | Source: Ambulatory Visit | Attending: Family Medicine | Admitting: Family Medicine

## 2020-12-04 DIAGNOSIS — R1031 Right lower quadrant pain: Secondary | ICD-10-CM | POA: Diagnosis not present

## 2020-12-04 DIAGNOSIS — I700 Atherosclerosis of aorta: Secondary | ICD-10-CM | POA: Diagnosis not present

## 2020-12-04 IMAGING — CT CT ABD-PELV W/ CM
2 of 5 series · 16 of 46 positions shown, 18 images · IV contrast (iopamidol)
Comparison: None.

CLINICAL DATA: Right lower quadrant abdominal pain.

EXAM:
CT ABDOMEN AND PELVIS WITH CONTRAST
TECHNIQUE: Multidetector CT imaging of the abdomen and pelvis was performed
using the standard protocol following bolus administration of
intravenous contrast.
CONTRAST:  100mL [4R] IOPAMIDOL ([4R]) INJECTION 61%

[Series 2: abd pelvis 5.00 br40 s3 axial · axial · 0.74mm/px · z∈[+1112,+1502]mm · 13 of 90 slices shown, 15 images]
[im 6/90  soft-tissue]
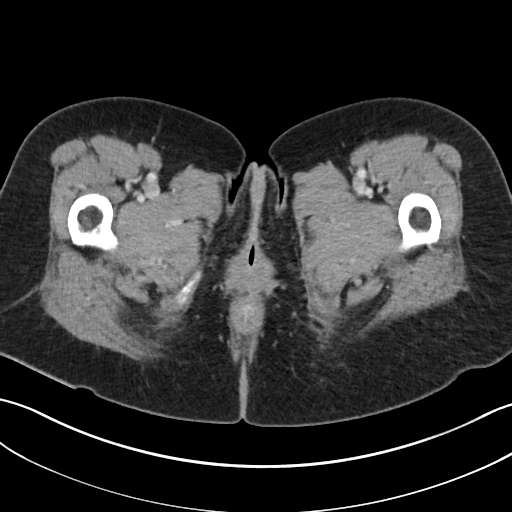
[im 6/90  bone]
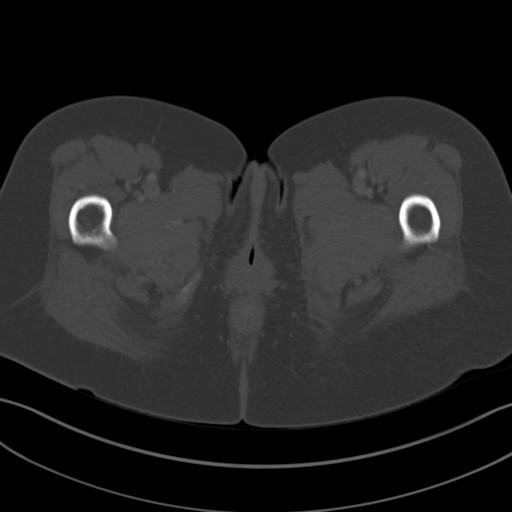
[im 12/90  soft-tissue]
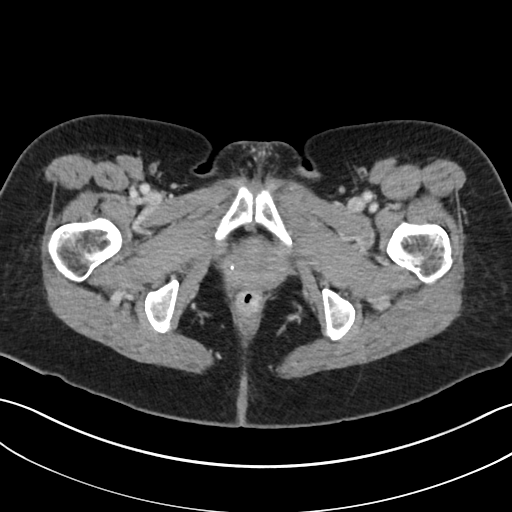
[im 17/90  soft-tissue]
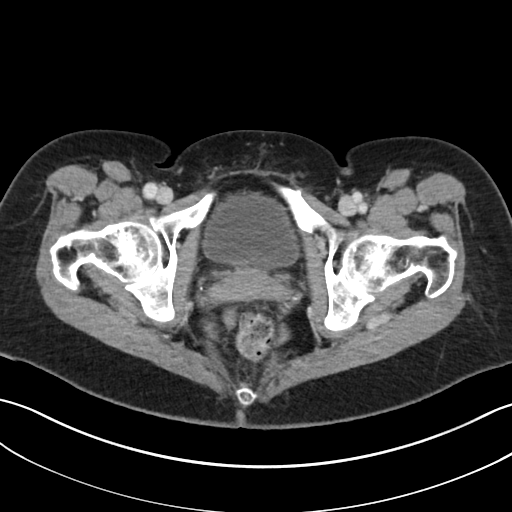
[im 28/90  soft-tissue]
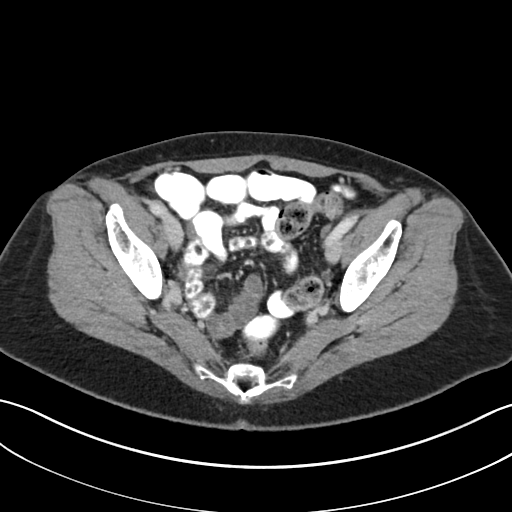
[im 34/90  soft-tissue]
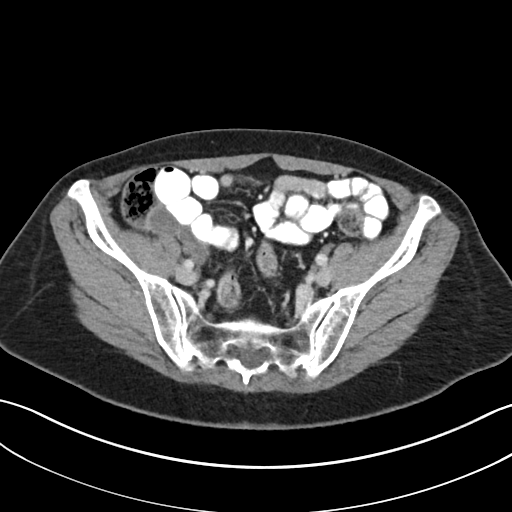
[im 39/90  soft-tissue]
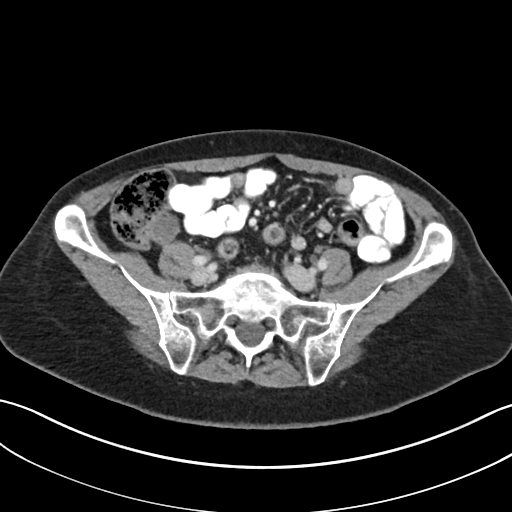
[im 45/90  soft-tissue]
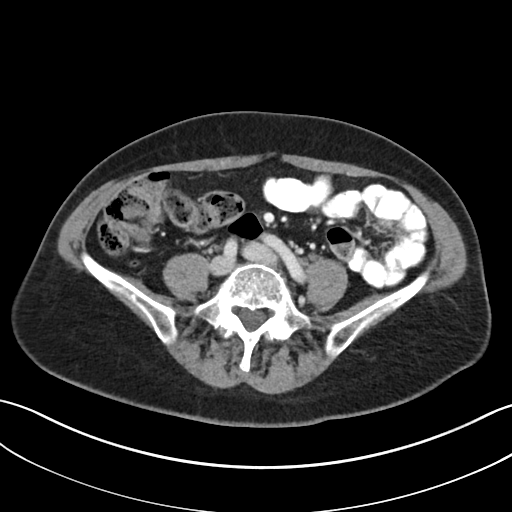
[im 51/90  soft-tissue]
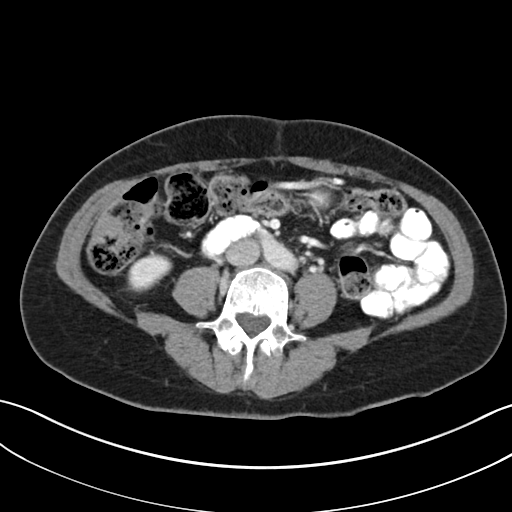
[im 56/90  soft-tissue]
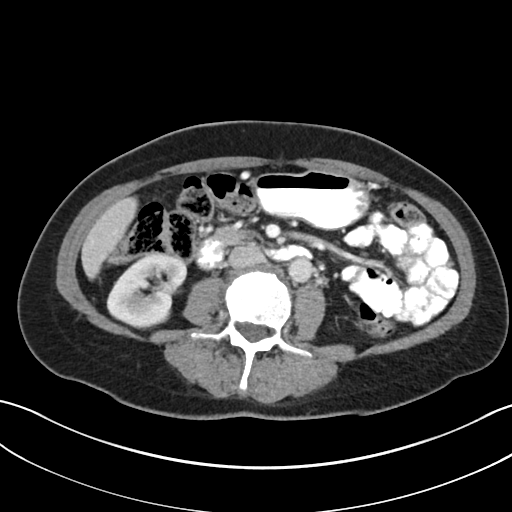
[im 56/90  bone]
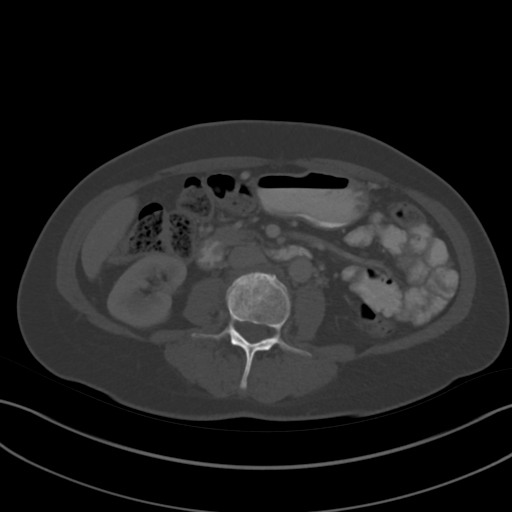
[im 62/90  soft-tissue]
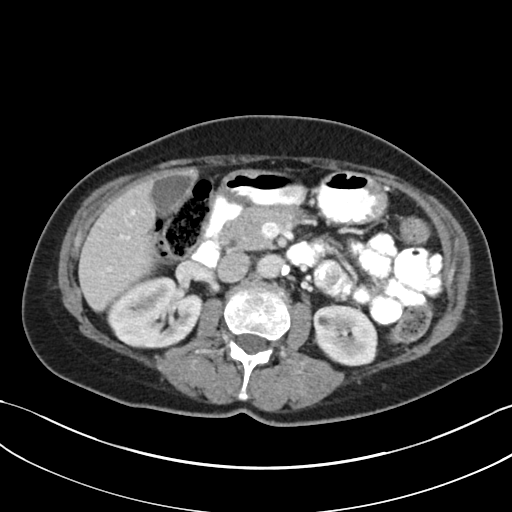
[im 73/90  soft-tissue]
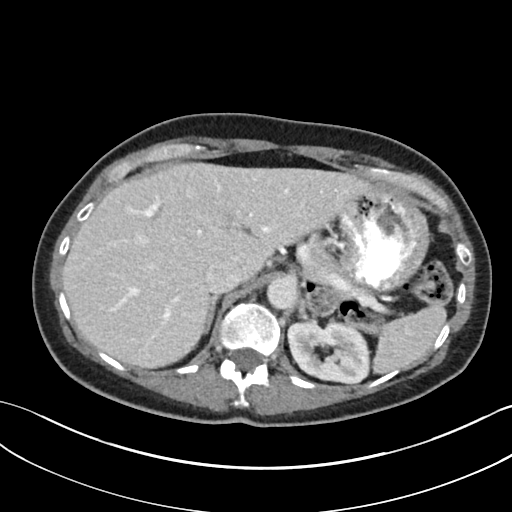
[im 78/90  soft-tissue]
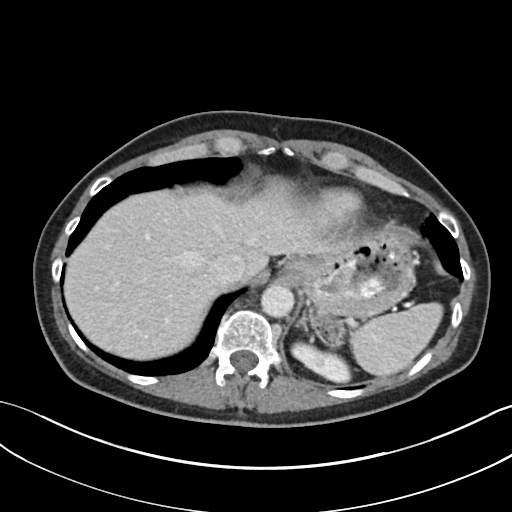
[im 84/90  soft-tissue]
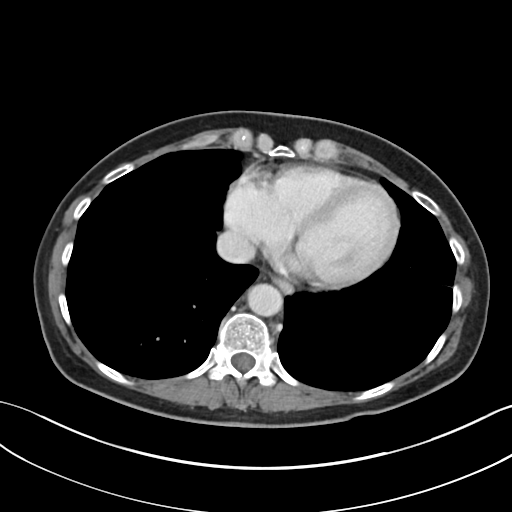

[Series 6: abd pelvis 2.00 br40 s3 cor · coronal · 0.74mm/px · 3 of 131 slices shown]
[im 44/131  soft-tissue]
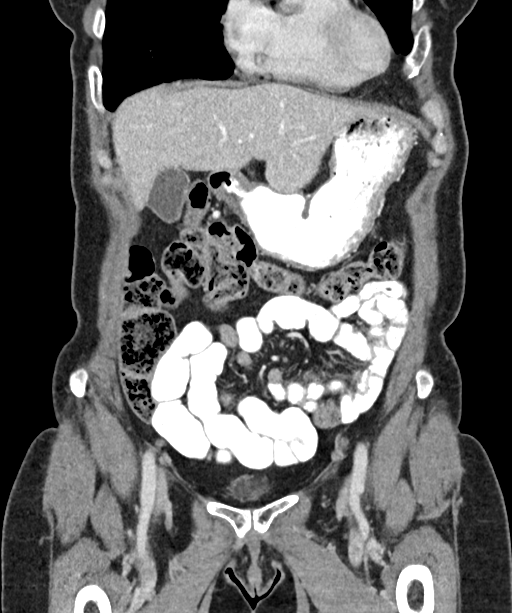
[im 58/131  soft-tissue]
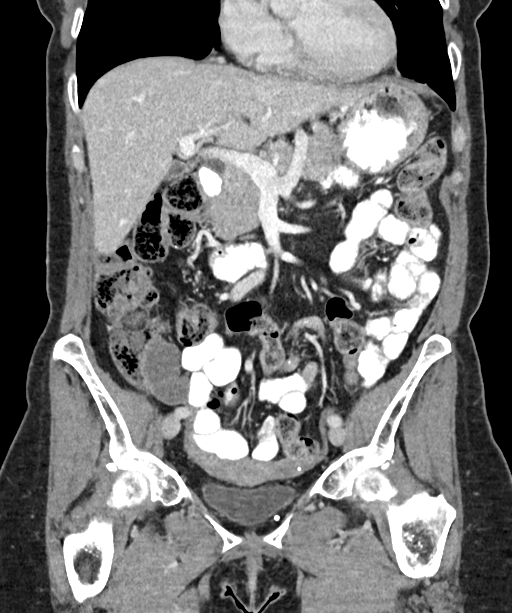
[im 73/131  soft-tissue]
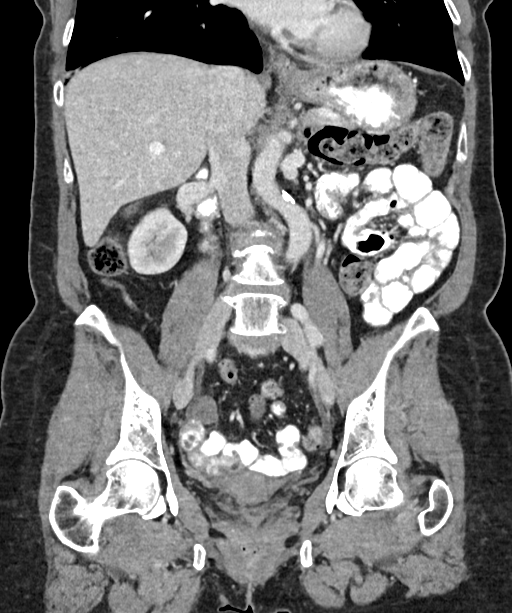

[16 of 46 positions shown; findings below may reference images not displayed]

FINDINGS: Lower chest: The lung bases are clear. The heart size is normal.

Hepatobiliary: The liver is normal. Normal gallbladder.There is no
biliary ductal dilation.

Pancreas: Normal contours without ductal dilatation. No
peripancreatic fluid collection.

Spleen: Unremarkable.

Adrenals/Urinary Tract:

--Adrenal glands: Unremarkable.

--Right kidney/ureter: No hydronephrosis or radiopaque kidney
stones.

--Left kidney/ureter: No hydronephrosis or radiopaque kidney stones.

--Urinary bladder: Unremarkable.

Stomach/Bowel:

--Stomach/Duodenum: No hiatal hernia or other gastric abnormality.
Normal duodenal course and caliber.

--Small bowel: Unremarkable.

--Colon: Unremarkable.

--Appendix: Normal.

Vascular/Lymphatic: Atherosclerotic calcification is present within
the non-aneurysmal abdominal aorta, without hemodynamically
significant stenosis.

--No retroperitoneal lymphadenopathy.

--No mesenteric lymphadenopathy.

--No pelvic or inguinal lymphadenopathy.

Reproductive: Unremarkable

Other: No ascites or free air. The abdominal wall is normal.

Musculoskeletal. There are prominent Schmorl's nodes involving the
L2 and L4 vertebral bodies. There is sclerosis in the superior
endplate of the L4 vertebral body suggestive of an age-indeterminate
compression fracture. Mild disc height loss is noted throughout the
lumbar spine.
IMPRESSION: 1. Normal appendix in the right lower quadrant.
2. Sclerosis in the superior endplate of the L4 vertebral body
suggestive of an age-indeterminate compression fracture. Correlation
with point tenderness is recommended.

Aortic Atherosclerosis ([4R]-[4R]).

## 2020-12-04 MED ORDER — IOPAMIDOL (ISOVUE-300) INJECTION 61%
100.0000 mL | Freq: Once | INTRAVENOUS | Status: AC | PRN
  Administered 2020-12-04: 100 mL via INTRAVENOUS

## 2020-12-06 ENCOUNTER — Other Ambulatory Visit: Payer: Self-pay | Admitting: Family Medicine

## 2020-12-06 ENCOUNTER — Encounter: Payer: Self-pay | Admitting: *Deleted

## 2020-12-06 DIAGNOSIS — E2839 Other primary ovarian failure: Secondary | ICD-10-CM

## 2020-12-06 DIAGNOSIS — M8580 Other specified disorders of bone density and structure, unspecified site: Secondary | ICD-10-CM

## 2020-12-06 NOTE — Progress Notes (Signed)
Patient ID: Savannah Herring, female   DOB: [birth year], 65 y.o.   MRN: [id number] ZIO XT long term holter monitor returned to [facility] unused.  Enrollment cancelled.

## 2020-12-14 ENCOUNTER — Other Ambulatory Visit: Payer: BC Managed Care – PPO

## 2020-12-27 ENCOUNTER — Other Ambulatory Visit: Payer: Self-pay | Admitting: Family Medicine

## 2020-12-27 DIAGNOSIS — R3129 Other microscopic hematuria: Secondary | ICD-10-CM

## 2020-12-27 DIAGNOSIS — R1031 Right lower quadrant pain: Secondary | ICD-10-CM

## 2020-12-27 DIAGNOSIS — R109 Unspecified abdominal pain: Secondary | ICD-10-CM

## 2021-01-01 ENCOUNTER — Other Ambulatory Visit: Payer: BC Managed Care – PPO

## 2021-01-03 ENCOUNTER — Ambulatory Visit
Admission: RE | Admit: 2021-01-03 | Discharge: 2021-01-03 | Disposition: A | Discharge status: Home / Self Care | Payer: BC Managed Care – PPO | Source: Ambulatory Visit | Attending: Family Medicine | Admitting: Family Medicine

## 2021-01-03 ENCOUNTER — Other Ambulatory Visit: Payer: Self-pay

## 2021-01-03 DIAGNOSIS — E2839 Other primary ovarian failure: Secondary | ICD-10-CM

## 2021-01-03 DIAGNOSIS — Z780 Asymptomatic menopausal state: Secondary | ICD-10-CM | POA: Diagnosis not present

## 2021-01-03 DIAGNOSIS — M8580 Other specified disorders of bone density and structure, unspecified site: Secondary | ICD-10-CM

## 2021-01-03 DIAGNOSIS — M8589 Other specified disorders of bone density and structure, multiple sites: Secondary | ICD-10-CM | POA: Diagnosis not present

## 2021-01-19 DIAGNOSIS — M25551 Pain in right hip: Secondary | ICD-10-CM | POA: Diagnosis not present

## 2021-01-19 DIAGNOSIS — M5441 Lumbago with sciatica, right side: Secondary | ICD-10-CM | POA: Diagnosis not present

## 2021-01-19 DIAGNOSIS — M545 Low back pain, unspecified: Secondary | ICD-10-CM | POA: Diagnosis not present

## 2021-01-26 DIAGNOSIS — M545 Low back pain, unspecified: Secondary | ICD-10-CM | POA: Diagnosis not present

## 2021-01-26 DIAGNOSIS — S335XXS Sprain of ligaments of lumbar spine, sequela: Secondary | ICD-10-CM | POA: Diagnosis not present

## 2021-01-26 DIAGNOSIS — S336XXS Sprain of sacroiliac joint, sequela: Secondary | ICD-10-CM | POA: Diagnosis not present

## 2021-01-30 DIAGNOSIS — S336XXS Sprain of sacroiliac joint, sequela: Secondary | ICD-10-CM | POA: Diagnosis not present

## 2021-01-30 DIAGNOSIS — S335XXS Sprain of ligaments of lumbar spine, sequela: Secondary | ICD-10-CM | POA: Diagnosis not present

## 2021-02-01 DIAGNOSIS — S336XXA Sprain of sacroiliac joint, initial encounter: Secondary | ICD-10-CM | POA: Diagnosis not present

## 2021-02-01 DIAGNOSIS — S335XXS Sprain of ligaments of lumbar spine, sequela: Secondary | ICD-10-CM | POA: Diagnosis not present

## 2021-02-06 DIAGNOSIS — S335XXS Sprain of ligaments of lumbar spine, sequela: Secondary | ICD-10-CM | POA: Diagnosis not present

## 2021-02-06 DIAGNOSIS — S336XXS Sprain of sacroiliac joint, sequela: Secondary | ICD-10-CM | POA: Diagnosis not present

## 2021-02-08 DIAGNOSIS — S336XXS Sprain of sacroiliac joint, sequela: Secondary | ICD-10-CM | POA: Diagnosis not present

## 2021-02-08 DIAGNOSIS — S335XXS Sprain of ligaments of lumbar spine, sequela: Secondary | ICD-10-CM | POA: Diagnosis not present

## 2021-02-12 DIAGNOSIS — S335XXS Sprain of ligaments of lumbar spine, sequela: Secondary | ICD-10-CM | POA: Diagnosis not present

## 2021-02-12 DIAGNOSIS — S336XXS Sprain of sacroiliac joint, sequela: Secondary | ICD-10-CM | POA: Diagnosis not present

## 2021-02-14 DIAGNOSIS — Z23 Encounter for immunization: Secondary | ICD-10-CM | POA: Diagnosis not present

## 2021-02-14 DIAGNOSIS — M810 Age-related osteoporosis without current pathological fracture: Secondary | ICD-10-CM | POA: Diagnosis not present

## 2021-02-14 DIAGNOSIS — Z13220 Encounter for screening for lipoid disorders: Secondary | ICD-10-CM | POA: Diagnosis not present

## 2021-02-14 DIAGNOSIS — S335XXS Sprain of ligaments of lumbar spine, sequela: Secondary | ICD-10-CM | POA: Diagnosis not present

## 2021-02-14 DIAGNOSIS — S336XXS Sprain of sacroiliac joint, sequela: Secondary | ICD-10-CM | POA: Diagnosis not present

## 2021-02-14 DIAGNOSIS — Z0000 Encounter for general adult medical examination without abnormal findings: Secondary | ICD-10-CM | POA: Diagnosis not present

## 2021-02-21 DIAGNOSIS — S335XXS Sprain of ligaments of lumbar spine, sequela: Secondary | ICD-10-CM | POA: Diagnosis not present

## 2021-02-21 DIAGNOSIS — S336XXS Sprain of sacroiliac joint, sequela: Secondary | ICD-10-CM | POA: Diagnosis not present

## 2021-03-22 DIAGNOSIS — Z01419 Encounter for gynecological examination (general) (routine) without abnormal findings: Secondary | ICD-10-CM | POA: Diagnosis not present

## 2021-03-27 DIAGNOSIS — N949 Unspecified condition associated with female genital organs and menstrual cycle: Secondary | ICD-10-CM | POA: Diagnosis not present

## 2021-03-27 DIAGNOSIS — R102 Pelvic and perineal pain: Secondary | ICD-10-CM | POA: Diagnosis not present

## 2021-04-23 DIAGNOSIS — R0981 Nasal congestion: Secondary | ICD-10-CM | POA: Diagnosis not present

## 2021-04-23 DIAGNOSIS — R059 Cough, unspecified: Secondary | ICD-10-CM | POA: Diagnosis not present

## 2021-04-23 DIAGNOSIS — H6503 Acute serous otitis media, bilateral: Secondary | ICD-10-CM | POA: Diagnosis not present

## 2021-04-23 DIAGNOSIS — Z20822 Contact with and (suspected) exposure to covid-19: Secondary | ICD-10-CM | POA: Diagnosis not present

## 2021-04-23 DIAGNOSIS — J069 Acute upper respiratory infection, unspecified: Secondary | ICD-10-CM | POA: Diagnosis not present

## 2021-05-28 ENCOUNTER — Other Ambulatory Visit: Payer: BC Managed Care – PPO

## 2021-06-08 DIAGNOSIS — M4850XA Collapsed vertebra, not elsewhere classified, site unspecified, initial encounter for fracture: Secondary | ICD-10-CM | POA: Diagnosis not present

## 2021-06-08 DIAGNOSIS — Z8262 Family history of osteoporosis: Secondary | ICD-10-CM | POA: Diagnosis not present

## 2021-06-08 DIAGNOSIS — Z8489 Family history of other specified conditions: Secondary | ICD-10-CM | POA: Diagnosis not present

## 2021-06-08 DIAGNOSIS — M8000XA Age-related osteoporosis with current pathological fracture, unspecified site, initial encounter for fracture: Secondary | ICD-10-CM | POA: Diagnosis not present

## 2021-06-26 DIAGNOSIS — L570 Actinic keratosis: Secondary | ICD-10-CM | POA: Diagnosis not present

## 2021-07-04 ENCOUNTER — Other Ambulatory Visit: Payer: Self-pay

## 2021-07-04 ENCOUNTER — Ambulatory Visit: Payer: BC Managed Care – PPO | Attending: Internal Medicine

## 2021-07-04 DIAGNOSIS — M545 Low back pain, unspecified: Secondary | ICD-10-CM | POA: Diagnosis not present

## 2021-07-04 DIAGNOSIS — M6281 Muscle weakness (generalized): Secondary | ICD-10-CM | POA: Diagnosis not present

## 2021-07-04 NOTE — Therapy (Signed)
[facility] [address] [place], [place], [address] Phone: [phone]   Fax:  [phone]  Physical Therapy Evaluation  Patient Details  Name: Savannah HearingJane C Herring MRN: [id number] Date of Birth: 04-Nov-1955 Referring Provider (PT): Talmage CoinKerr, Jeffrey, MD   Encounter Date: 07/04/2021   PT End of Session - 07/04/21 1158     Visit Number 1    Number of Visits 17    Date for PT Re-Evaluation 09/12/21    Authorization Type BC[employer]    PT Start Time 1000    PT Stop Time 1045    PT Time Calculation (min) 45 min    Activity Tolerance Patient tolerated treatment well;Patient limited by pain    Behavior [facility] tasks assessed/performed             Past Medical History:  Diagnosis Date   Anxiety    Depression    Medical history non-contributory    Wears glasses     Past Surgical History:  Procedure Laterality Date   COLONOSCOPY     SINUS ENDO W/FUSION Right 12/27/2013   Procedure: ENDOSCOPIC SINUS SURGERY WITH BIOPSY, FROZEN SECTION,EXCISION RIGHT NASAL SEPTAL MASS ANDFUSION NAVIGATION;  Surgeon: Flo ShanksKarol Wolicki, MD;  Location: [facility];  Service: ENT;  Laterality: Right;   WISDOM TOOTH EXTRACTION      There were no vitals filed for this visit.    Subjective Assessment - 07/04/21 1002     Subjective Pt is a pleasant 65 y/o F who presents to PT with reports of acute lower back pain since mid August. She has diagnosis of osteoporosis and PMH significant for lumbar compression fxs. She notes that her pain from previous compression fxs decreased after PT treamtent earlier this summer, however, this recent onset of pain is slightly higher of her spine, in mid/lower flank, and is a new onset after picking up her grandchildren in August. She has not seen MD again or had any repeat/recent imaginig since new onset of pain. Unable to perform previous HEP d/t pain, which prior to new onset was pain relieving. Denies LE  paresthesias, b/b changes, or saddle anesthesia. [place] and is worried about increased pain and limited mobility during this trip.    Pertinent History compression fx's L2-4    How long can you sit comfortably? 10-15 min    How long can you stand comfortably? 30 min    How long can you walk comfortably? indefinite - walking relieves pain    Diagnostic tests pt supplied MRI report relieving previous compression fx's L2-L4    Currently in Pain? Yes    Pain Score 2    8/10 at worst   Pain Location Back    Pain Orientation Right;Left;Lower;Mid    Pain Descriptors / Indicators Sharp    Pain Type Acute pain    Pain Onset 1 to 4 weeks ago    Pain Frequency Intermittent    Aggravating Factors  rolling, firsting movements in morning    Pain Relieving Factors heat, walking, movement                [facility] PT Assessment - 07/04/21 0001       Assessment   Medical Diagnosis M80.00XA (ICD-10-CM) - Age-related osteoporosis with current pathological fracture, unspecified site, initial encounter for fracture  M48.50XA (ICD-10-CM) - Collapsed vertebra, not elsewhere classified, site unspecified, initial encounter for  fracture    Referring Provider (PT) Talmage Coin, MD    Onset Date/Surgical Date --   Mid-August 2022   Hand Dominance Right    Prior Therapy Yes, previous OPPT for lumbar compression fxs with good success      Precautions   Precaution Comments Osteoporosis      Restrictions   Weight Bearing Restrictions No      Balance Screen   Has the patient fallen in the past 6 months No    Has the patient had a decrease in activity level because of a fear of falling?  No    Is the patient reluctant to leave their home because of a fear of falling?  No      Home Environment   Living Environment Private residence    Living Arrangements Spouse/significant other    Type of Home House    Home Layout Two  level    Additional Comments stairs to enter and 14 stairs to bedroom level; pt denies barriers with home navigation      Prior Function   Level of Independence Independent;Independent with basic ADLs    Vocation Retired    Leisure travel      Cognition   Overall Cognitive Status Within Functional Limits for tasks assessed    Attention Focused      Observation/Other Assessments   Focus on Therapeutic Outcomes (FOTO)  55% function; 72% predicted      Sensation   Light Touch Appears Intact      Functional Tests   Functional tests Sit to Stand;Other      Sit to Stand   Comments indep      Other:   Other/ Comments unable to perform supine bridge d/t pain      AROM   Overall AROM Comments UE AROM WNL; increased lower thoracic pain with elevation, abd      Strength   Overall Strength Comments UE Myotomes WNL; general proximal hip weakness as noted below    Right Hip Flexion 3+/5    Right Hip ABduction 3+/5    Right Hip ADduction 3+/5    Left Hip Flexion 3+/5    Left Hip ABduction 3+/5    Left Hip ADduction 3+/5    Right Knee Flexion 5/5    Right Knee Extension 5/5    Left Knee Flexion 5/5    Left Knee Extension 5/5    Right Ankle Dorsiflexion 5/5    Right Ankle Plantar Flexion 5/5    Left Ankle Dorsiflexion 5/5    Left Ankle Plantar Flexion 5/5      Palpation   Palpation comment TTP to thoracic/lumbar paraspinals      Transfers   Transfers Supine to Sit;Sit to Supine    Five time sit to stand comments  15 seconds    Supine to Sit 7: Independent   increased pain   Sit to Supine 7: Independent                        Objective measurements completed on examination: See above findings.               PT Education - 07/04/21 1152     Education Details eval findings, FOTO score, HEP, POC    Person(s) Educated Patient    Methods Explanation;Demonstration;Handout    Comprehension Verbalized understanding;Returned demonstration               PT Short Term Goals - 07/04/21  1159       PT SHORT TERM GOAL #1   Title Pt will be compliant and knowledgeable with 90% of initial HEP for improved comfort and carryover    Baseline initial HEP given    Time 3    Period Weeks    Status New    Target Date 07/25/21      PT SHORT TERM GOAL #2   Title Pt will decrease 5xSTS time to no greater than 12 seconds for improved strength and functional mobility    Baseline 15 seconds    Time 3    Period Weeks    Status New    Target Date 07/25/21               PT Long Term Goals - 07/04/21 1203       PT LONG TERM GOAL #1   Title Pt will improve FOTO score to at least 72% as proxy for functional improvement    Baseline 55% function    Time 10    Period Weeks    Status New    Target Date 09/12/21      PT LONG TERM GOAL #2   Title Pt will self report back pain no greater than 3/10 at worst for improved comfort and function    Baseline 8/10 at worst    Time 10    Period Weeks    Status New    Target Date 09/12/21      PT LONG TERM GOAL #3   Title Pt will be able to squat and lift 25lbs with no increase in back pain and improved body mechanics for increased functional ability when lifitng grandchildren    Baseline unable    Time 10    Period Weeks    Status New    Target Date 09/12/21                    Plan - 07/04/21 1348     Clinical Impression Statement Pt is a pleasant 65 y/o F who presesnts to PT with acute/chronic back pain. Physical findings are consistent with MD impression and PMH of compression fxs, with increased back pain and reduced functional mobility noted. Due to recent onset of new pain and past compression fx's, PT recommended pt be checked by familar orthopedic MD to assess risk of potentially new compression fx. PT educated pt on lifting mechanics and on HEP with decompressive exercises and avoiding loaded spinal flexioin at present. WIll assess response to HEP after MD visit and progress  as able.    Personal Factors and Comorbidities Comorbidity 2    Comorbidities PMH: Anxiety & Depression    Examination-Activity Limitations Stand;Lift;Sit;Locomotion Level;Carry    Examination-Participation Restrictions Yard Work;Community Activity    Stability/Clinical Decision Making Stable/Uncomplicated    Clinical Decision Making Low    Rehab Potential Good    PT Frequency --   1-2x/wk   PT Duration --   10 weeks   PT Treatment/Interventions ADLs/Self Care Home Management;Neuromuscular re-education;Electrical Stimulation;Cryotherapy;Aquatic Therapy;Moist Heat;Gait training;Stair training;Functional mobility training;Therapeutic activities;Therapeutic exercise;Balance training;Patient/family education;Manual techniques;Dry needling;Taping    PT Next Visit Plan assess response to HEP and progress as able    PT Home Exercise Plan Access Code: [email or web address]    Consulted and Agree with Plan of Care Patient             Patient will benefit from skilled therapeutic intervention in order to improve the following deficits and impairments:  Decreased activity  tolerance, Decreased endurance, Decreased mobility, Decreased strength, Improper body mechanics, Pain, Postural dysfunction  Visit Diagnosis: Acute bilateral low back pain without sciatica - Plan: PT plan of care cert/re-cert  Muscle weakness (generalized) - Plan: PT plan of care cert/re-cert     Problem List Patient Active Problem List   Diagnosis Date Noted   Anxiety 03/18/2015    Eloy End, PT 07/04/2021, 1:55 PM  [facility] [address] [place], [place], [address] Phone: [phone]   Fax:  [phone]  Name: Savannah Herring MRN: [id number] Date of Birth: 03-Sep-1956

## 2021-07-05 DIAGNOSIS — M545 Low back pain, unspecified: Secondary | ICD-10-CM | POA: Diagnosis not present

## 2021-07-05 DIAGNOSIS — M546 Pain in thoracic spine: Secondary | ICD-10-CM | POA: Diagnosis not present

## 2021-07-19 DIAGNOSIS — D122 Benign neoplasm of ascending colon: Secondary | ICD-10-CM | POA: Diagnosis not present

## 2021-07-19 DIAGNOSIS — Z86010 Personal history of colonic polyps: Secondary | ICD-10-CM | POA: Diagnosis not present

## 2021-08-08 DIAGNOSIS — L821 Other seborrheic keratosis: Secondary | ICD-10-CM | POA: Diagnosis not present

## 2021-08-08 DIAGNOSIS — L814 Other melanin hyperpigmentation: Secondary | ICD-10-CM | POA: Diagnosis not present

## 2021-08-08 DIAGNOSIS — Z85828 Personal history of other malignant neoplasm of skin: Secondary | ICD-10-CM | POA: Diagnosis not present

## 2021-08-08 DIAGNOSIS — L738 Other specified follicular disorders: Secondary | ICD-10-CM | POA: Diagnosis not present

## 2021-12-10 DIAGNOSIS — R197 Diarrhea, unspecified: Secondary | ICD-10-CM | POA: Diagnosis not present

## 2022-01-30 ENCOUNTER — Other Ambulatory Visit: Payer: Self-pay

## 2022-01-30 DIAGNOSIS — R197 Diarrhea, unspecified: Secondary | ICD-10-CM | POA: Diagnosis not present

## 2022-01-30 DIAGNOSIS — R1011 Right upper quadrant pain: Secondary | ICD-10-CM | POA: Diagnosis not present

## 2022-01-30 DIAGNOSIS — K219 Gastro-esophageal reflux disease without esophagitis: Secondary | ICD-10-CM | POA: Diagnosis not present

## 2022-02-14 ENCOUNTER — Other Ambulatory Visit: Payer: BC Managed Care – PPO

## 2022-02-14 DIAGNOSIS — K2980 Duodenitis without bleeding: Secondary | ICD-10-CM | POA: Diagnosis not present

## 2022-02-14 DIAGNOSIS — K2930 Chronic superficial gastritis without bleeding: Secondary | ICD-10-CM | POA: Diagnosis not present

## 2022-02-14 DIAGNOSIS — K449 Diaphragmatic hernia without obstruction or gangrene: Secondary | ICD-10-CM | POA: Diagnosis not present

## 2022-02-14 DIAGNOSIS — R1011 Right upper quadrant pain: Secondary | ICD-10-CM | POA: Diagnosis not present

## 2022-02-14 DIAGNOSIS — K219 Gastro-esophageal reflux disease without esophagitis: Secondary | ICD-10-CM | POA: Diagnosis not present

## 2022-02-14 DIAGNOSIS — K2289 Other specified disease of esophagus: Secondary | ICD-10-CM | POA: Diagnosis not present

## 2022-02-14 DIAGNOSIS — K222 Esophageal obstruction: Secondary | ICD-10-CM | POA: Diagnosis not present

## 2022-02-14 DIAGNOSIS — R12 Heartburn: Secondary | ICD-10-CM | POA: Diagnosis not present

## 2022-02-14 DIAGNOSIS — K317 Polyp of stomach and duodenum: Secondary | ICD-10-CM | POA: Diagnosis not present

## 2022-02-14 DIAGNOSIS — K2970 Gastritis, unspecified, without bleeding: Secondary | ICD-10-CM | POA: Diagnosis not present

## 2022-02-20 DIAGNOSIS — K2930 Chronic superficial gastritis without bleeding: Secondary | ICD-10-CM | POA: Diagnosis not present

## 2022-02-20 DIAGNOSIS — K2980 Duodenitis without bleeding: Secondary | ICD-10-CM | POA: Diagnosis not present

## 2022-02-20 DIAGNOSIS — K219 Gastro-esophageal reflux disease without esophagitis: Secondary | ICD-10-CM | POA: Diagnosis not present

## 2022-02-20 DIAGNOSIS — K317 Polyp of stomach and duodenum: Secondary | ICD-10-CM | POA: Diagnosis not present

## 2022-02-27 ENCOUNTER — Inpatient Hospital Stay: Admission: RE | Admit: 2022-02-27 | Payer: BC Managed Care – PPO | Source: Ambulatory Visit

## 2022-03-08 ENCOUNTER — Ambulatory Visit
Admission: RE | Admit: 2022-03-08 | Discharge: 2022-03-08 | Disposition: A | Discharge status: Home / Self Care | Payer: Self-pay | Source: Ambulatory Visit | Attending: *Deleted | Admitting: *Deleted

## 2022-03-08 DIAGNOSIS — K760 Fatty (change of) liver, not elsewhere classified: Secondary | ICD-10-CM | POA: Diagnosis not present

## 2022-03-08 DIAGNOSIS — R1011 Right upper quadrant pain: Secondary | ICD-10-CM | POA: Diagnosis not present

## 2022-03-20 DIAGNOSIS — M810 Age-related osteoporosis without current pathological fracture: Secondary | ICD-10-CM | POA: Diagnosis not present

## 2022-03-20 DIAGNOSIS — Z13220 Encounter for screening for lipoid disorders: Secondary | ICD-10-CM | POA: Diagnosis not present

## 2022-03-20 DIAGNOSIS — K219 Gastro-esophageal reflux disease without esophagitis: Secondary | ICD-10-CM | POA: Diagnosis not present

## 2022-03-29 ENCOUNTER — Other Ambulatory Visit: Payer: Self-pay

## 2022-03-29 DIAGNOSIS — M810 Age-related osteoporosis without current pathological fracture: Secondary | ICD-10-CM

## 2022-04-01 ENCOUNTER — Telehealth: Payer: Self-pay | Admitting: Pharmacy Technician

## 2022-04-01 NOTE — Telephone Encounter (Signed)
Auth Submission: no auth needed Payer: [employer] Medication & CPT/J Code(s) submitted: Reclast (Zolendronic acid) [id number] Route of submission (phone, fax, portal): phone: [phone] Auth type: Buy/Bill Units/visits requested: x1 dose Reference number: [id number] Approval from: 04/01/22 to 04/02/23

## 2022-04-04 DIAGNOSIS — L986 Other infiltrative disorders of the skin and subcutaneous tissue: Secondary | ICD-10-CM | POA: Diagnosis not present

## 2022-04-04 DIAGNOSIS — L820 Inflamed seborrheic keratosis: Secondary | ICD-10-CM | POA: Diagnosis not present

## 2022-04-04 DIAGNOSIS — L570 Actinic keratosis: Secondary | ICD-10-CM | POA: Diagnosis not present

## 2022-04-04 DIAGNOSIS — L821 Other seborrheic keratosis: Secondary | ICD-10-CM | POA: Diagnosis not present

## 2022-04-04 DIAGNOSIS — D485 Neoplasm of uncertain behavior of skin: Secondary | ICD-10-CM | POA: Diagnosis not present

## 2022-04-04 DIAGNOSIS — L738 Other specified follicular disorders: Secondary | ICD-10-CM | POA: Diagnosis not present

## 2022-04-09 ENCOUNTER — Encounter: Payer: Self-pay | Admitting: Pulmonary Disease

## 2022-04-16 ENCOUNTER — Ambulatory Visit (INDEPENDENT_AMBULATORY_CARE_PROVIDER_SITE_OTHER): Payer: 59

## 2022-04-16 VITALS — BP 124/86 | HR 61 | Temp 97.5°F | Resp 16 | Ht 67.0 in | Wt 146.2 lb

## 2022-04-16 DIAGNOSIS — M810 Age-related osteoporosis without current pathological fracture: Secondary | ICD-10-CM

## 2022-04-16 MED ORDER — ZOLEDRONIC ACID 5 MG/100ML IV SOLN
5.0000 mg | Freq: Once | INTRAVENOUS | Status: AC
  Administered 2022-04-16: 5 mg via INTRAVENOUS
  Filled 2022-04-16: qty 100

## 2022-04-16 MED ORDER — ACETAMINOPHEN 325 MG PO TABS
650.0000 mg | ORAL_TABLET | Freq: Once | ORAL | Status: AC
  Administered 2022-04-16: 650 mg via ORAL
  Filled 2022-04-16: qty 2

## 2022-04-16 MED ORDER — DIPHENHYDRAMINE HCL 25 MG PO CAPS
25.0000 mg | ORAL_CAPSULE | Freq: Once | ORAL | Status: AC
  Administered 2022-04-16: 25 mg via ORAL
  Filled 2022-04-16: qty 1

## 2022-04-16 NOTE — Progress Notes (Signed)
Diagnosis: Osteoporosis  Provider:  Chilton Greathouse, MD  Procedure: Infusion  IV Type: Peripheral, IV Location: L Antecubital  Reclast (Zolendronic Acid), Dose: 5 mg  Infusion Start Time: 1011  Infusion Stop Time: 1044  Post Infusion IV Care: Observation period completed and Peripheral IV Discontinued  Discharge: Condition: Good, Destination: Home . AVS provided to patient.   Performed by:  Nat Math, RN

## 2022-04-16 NOTE — Patient Instructions (Signed)

## 2022-04-25 DIAGNOSIS — D485 Neoplasm of uncertain behavior of skin: Secondary | ICD-10-CM | POA: Diagnosis not present

## 2022-05-02 DIAGNOSIS — L570 Actinic keratosis: Secondary | ICD-10-CM | POA: Diagnosis not present

## 2022-09-05 ENCOUNTER — Other Ambulatory Visit: Payer: Self-pay | Admitting: Family Medicine

## 2022-09-05 DIAGNOSIS — Z1231 Encounter for screening mammogram for malignant neoplasm of breast: Secondary | ICD-10-CM

## 2022-10-02 DIAGNOSIS — R69 Illness, unspecified: Secondary | ICD-10-CM | POA: Diagnosis not present

## 2022-10-07 DIAGNOSIS — Z85828 Personal history of other malignant neoplasm of skin: Secondary | ICD-10-CM | POA: Diagnosis not present

## 2022-10-07 DIAGNOSIS — L720 Epidermal cyst: Secondary | ICD-10-CM | POA: Diagnosis not present

## 2022-10-07 DIAGNOSIS — L738 Other specified follicular disorders: Secondary | ICD-10-CM | POA: Diagnosis not present

## 2022-10-07 DIAGNOSIS — D225 Melanocytic nevi of trunk: Secondary | ICD-10-CM | POA: Diagnosis not present

## 2022-10-07 DIAGNOSIS — D2261 Melanocytic nevi of right upper limb, including shoulder: Secondary | ICD-10-CM | POA: Diagnosis not present

## 2022-10-07 DIAGNOSIS — L570 Actinic keratosis: Secondary | ICD-10-CM | POA: Diagnosis not present

## 2022-10-07 DIAGNOSIS — L821 Other seborrheic keratosis: Secondary | ICD-10-CM | POA: Diagnosis not present

## 2022-10-07 DIAGNOSIS — D1801 Hemangioma of skin and subcutaneous tissue: Secondary | ICD-10-CM | POA: Diagnosis not present

## 2022-10-14 DIAGNOSIS — R69 Illness, unspecified: Secondary | ICD-10-CM | POA: Diagnosis not present

## 2022-10-16 DIAGNOSIS — J069 Acute upper respiratory infection, unspecified: Secondary | ICD-10-CM | POA: Diagnosis not present

## 2022-10-16 DIAGNOSIS — R69 Illness, unspecified: Secondary | ICD-10-CM | POA: Diagnosis not present

## 2022-10-16 DIAGNOSIS — F39 Unspecified mood [affective] disorder: Secondary | ICD-10-CM | POA: Diagnosis not present

## 2022-10-16 DIAGNOSIS — R002 Palpitations: Secondary | ICD-10-CM | POA: Diagnosis not present

## 2022-10-26 ENCOUNTER — Ambulatory Visit
Admission: EM | Admit: 2022-10-26 | Discharge: 2022-10-26 | Disposition: A | Discharge status: Home / Self Care | Payer: 59

## 2022-10-26 DIAGNOSIS — H1012 Acute atopic conjunctivitis, left eye: Secondary | ICD-10-CM | POA: Diagnosis not present

## 2022-10-26 DIAGNOSIS — J302 Other seasonal allergic rhinitis: Secondary | ICD-10-CM | POA: Diagnosis not present

## 2022-10-26 DIAGNOSIS — J4521 Mild intermittent asthma with (acute) exacerbation: Secondary | ICD-10-CM

## 2022-10-26 MED ORDER — METHYLPREDNISOLONE SODIUM SUCC 125 MG IJ SOLR: 80.0000 mg | Freq: Once | INTRAMUSCULAR | Status: DC

## 2022-10-26 MED ORDER — ALBUTEROL SULFATE HFA 108 (90 BASE) MCG/ACT IN AERS: 2.0000 | INHALATION_SPRAY | Freq: Four times a day (QID) | RESPIRATORY_TRACT | 2 refills | Status: AC | PRN

## 2022-10-26 MED ORDER — AEROCHAMBER PLUS FLO-VU LARGE MISC: 1.0000 | Freq: Once | 0 refills | Status: AC

## 2022-10-26 MED ORDER — FEXOFENADINE HCL 180 MG PO TABS: 180.0000 mg | ORAL_TABLET | Freq: Every day | ORAL | 2 refills | Status: DC

## 2022-10-26 MED ORDER — PROMETHAZINE-DM 6.25-15 MG/5ML PO SYRP: 5.0000 mL | ORAL_SOLUTION | Freq: Every evening | ORAL | 0 refills | Status: DC | PRN

## 2022-10-26 MED ORDER — MOMETASONE FUROATE 50 MCG/ACT NA SUSP: 2.0000 | Freq: Every day | NASAL | 2 refills | Status: AC

## 2022-10-26 MED ORDER — OLOPATADINE HCL 0.2 % OP SOLN: 1.0000 [drp] | Freq: Every day | OPHTHALMIC | 1 refills | Status: AC

## 2022-10-26 MED ORDER — TRIAMCINOLONE ACETONIDE 40 MG/ML IJ SUSP
40.0000 mg | Freq: Once | INTRAMUSCULAR | Status: AC
  Administered 2022-10-26: 40 mg via INTRAMUSCULAR

## 2022-10-26 MED ORDER — IPRATROPIUM BROMIDE 0.06 % NA SOLN: 2.0000 | Freq: Three times a day (TID) | NASAL | 1 refills | Status: AC

## 2022-10-26 MED ORDER — ALBUTEROL SULFATE (2.5 MG/3ML) 0.083% IN NEBU
2.5000 mg | INHALATION_SOLUTION | Freq: Once | RESPIRATORY_TRACT | Status: AC
  Administered 2022-10-26: 2.5 mg via RESPIRATORY_TRACT

## 2022-10-26 NOTE — Discharge Instructions (Addendum)
Your symptoms and my physical exam findings are concerning for exacerbation of your underlying allergies.     Please see the list below for recommended medications, dosages and frequencies to provide relief of current symptoms:     Kenalog IM (triamcinolone):  To quickly address your significant respiratory inflammation, you were provided with an injection of Kenalog in the office today.  You should continue to feel the full benefit of the steroid for the next 24-36 hours.    Allegra (fexofenadine): This is an excellent second-generation antihistamine that helps to reduce respiratory inflammatory response to environmental allergens.  This medication is not known to cause daytime sleepiness so it can be taken in the daytime.  If you find that it does make you sleepy, please feel free to take it at bedtime.   Nasonex (mometasone): This is a steroid nasal spray that you use once daily, 2 sprays in each nare.  This medication does not work well if you decide to use it only used as you feel you need to, it works best used on a daily basis.  After 3 to 5 days of use, you will notice significant reduction of the inflammation and mucus production that is currently being caused by exposure to allergens, whether seasonal or environmental.  The most common side effect of this medication is nosebleeds.  If you experience a nosebleed, please discontinue use for 1 week, then feel free to resume.  I have provided you with a prescription.     Atrovent (ipratropium): This is an excellent nasal decongestant spray I have added to your recommended nasal steroid that will not cause rebound congestion, please instill 2 sprays into each nare with each use.  Because nasal steroids can take several days before they begin to provide full benefit, I recommend that you use this spray in addition to the nasal steroid prescribed for you.  Please use it after you have used your nasal steroid and repeat up to 4 times daily as needed.  I  have provided you with a prescription for this medication.     Pataday (olopatadine): This is an antihistamine eyedrop that can be used once daily to help relieve dry eyes, itchy eyes and red eyes.  This antihistamine drop not only works for allergic conjunctivitis but is also very helpful with viral conjunctivitis.  Please do not use this drop more than once a day, for best relief please use this in the morning.     ProAir, Ventolin, Proventil (albuterol): This inhaled medication contains a short acting beta agonist bronchodilator.  This medication works on the smooth muscle that opens and constricts of your airways by relaxing the muscle.  The result of relaxation of the smooth muscle is increased air movement and improved work of breathing.  This is a short acting medication that can be used every 4-6 hours as needed for increased work of breathing, shortness of breath, wheezing and excessive coughing.  I have provided you with a prescription.    Promethazine DM: Promethazine is both a nasal decongestant and an antinausea medication that makes most patients feel fairly sleepy.  The DM is dextromethorphan, a cough suppressant found in many over-the-counter cough medications.  Please take 5 mL before bedtime to minimize your cough which will help you sleep better.  I have sent a prescription for this medication to your pharmacy.   Please follow-up within the next 5-7 days either with your primary care provider or urgent care if your symptoms do not resolve.  If you do not have a primary care provider, we will assist you in finding one.        Thank you for visiting urgent care today.  We appreciate the opportunity to participate in your care.

## 2022-10-26 NOTE — ED Provider Notes (Signed)
[facility]    CSN: [id number] Arrival date & time: 10/26/22  0865    HISTORY   Chief Complaint  Patient presents with   Conjunctivitis   Cough   HPI Savannah Herring is a pleasant, 66 y.o. female who presents to urgent care today. Patient complains of persistent cough that is not productive, left eye being itchy and red with some drainage and crustiness of her eyelashes in the morning for the past 2 mornings.  Patient states the cough has been going on much longer than that, states she gets the same cough every year.  Patient states she saw her primary care provider 3 days ago and they provided her with a prescription for [facility] which she states is not helping at all.  Patient denies fever, body aches, chills, known sick contacts, nausea, vomiting, diarrhea, headache.  Patient states she is tired of coughing, states she is not sleeping well at night secondary to cough and fears that she is keeping her husband awake as well.  Patient denies known history of allergies and asthma.  The history is provided by the patient.   Past Medical History:  Diagnosis Date   Anxiety    Depression    Medical history non-contributory    Wears glasses    Patient Active Problem List   Diagnosis Date Noted   Osteoporosis 03/29/2022   Anxiety 03/18/2015   Past Surgical History:  Procedure Laterality Date   COLONOSCOPY     SINUS ENDO W/FUSION Right 12/27/2013   Procedure: ENDOSCOPIC SINUS SURGERY WITH BIOPSY, FROZEN SECTION,EXCISION RIGHT NASAL SEPTAL MASS ANDFUSION NAVIGATION;  Surgeon: Flo Shanks, MD;  Location: [facility];  Service: ENT;  Laterality: Right;   WISDOM TOOTH EXTRACTION     OB History   No obstetric history on file.    Home Medications    Prior to Admission medications   Medication Sig Start Date End Date Taking? Authorizing Provider  albuterol (VENTOLIN HFA) 108 (90 Base) MCG/ACT inhaler Inhale 2 puffs into the lungs every 6 (six) hours as  needed for wheezing or shortness of breath (Cough). 10/26/22  Yes Theadora Rama Scales, PA-C  fexofenadine (ALLEGRA) 180 MG tablet Take 1 tablet (180 mg total) by mouth daily. 10/26/22 01/24/23 Yes Theadora Rama Scales, PA-C  ipratropium (ATROVENT) 0.06 % nasal spray Place 2 sprays into both nostrils 3 (three) times daily. As needed for nasal congestion, runny nose 10/26/22  Yes Theadora Rama Scales, PA-C  mometasone (NASONEX) 50 MCG/ACT nasal spray Place 2 sprays into the nose daily. 10/26/22 01/24/23 Yes Theadora Rama Scales, PA-C  promethazine-dextromethorphan (PROMETHAZINE-DM) 6.25-15 MG/5ML syrup Take 5 mLs by mouth at bedtime as needed for cough. 10/26/22  Yes Theadora Rama Scales, PA-C  Spacer/Aero-Holding Chambers (AEROCHAMBER PLUS FLO-VU LARGE) MISC 1 each by Other route once for 1 dose. 10/26/22 10/26/22 Yes Theadora Rama Scales, PA-C  calcium-vitamin D (OSCAL WITH D) 500-200 MG-UNIT per tablet Take 1 tablet by mouth.    [provider]  FLUoxetine (PROZAC) 20 MG capsule Take 60 mg by mouth daily.    [provider]  Multiple Vitamins-Minerals (MULTIVITAMIN WITH MINERALS) tablet Take 1 tablet by mouth daily.    [provider]    Family History Family History  Problem Relation Age of Onset   Cancer Father    Hypertension Father    Social History Social History   Tobacco Use   Smoking status: Never  Substance Use Topics   Alcohol use: Yes  Comment: occ   Drug use: No   Allergies   Sulfa antibiotics  Review of Systems Review of Systems Pertinent findings revealed after performing a 14 point review of systems has been noted in the history of present illness.  Physical Exam Triage Vital Signs ED Triage Vitals  Enc Vitals Group     BP 08/24/21 0827 (!) 147/82     Pulse Rate 08/24/21 0827 72     Resp 08/24/21 0827 18     Temp 08/24/21 0827 98.3 F (36.8 C)     Temp Source 08/24/21 0827 Oral     SpO2 08/24/21 0827 98 %     Weight  --      Height --      Head Circumference --      Peak Flow --      Pain Score 08/24/21 0826 5     Pain Loc --      Pain Edu? --      Excl. in GC? --   No data found.  Updated Vital Signs BP 127/71 (BP Location: Left Arm)   Pulse 87   Temp 98.1 F (36.7 C) (Oral)   Resp 16   SpO2 98%   Physical Exam Vitals and nursing note reviewed.  Constitutional:      General: She is not in acute distress.    Appearance: Normal appearance. She is not ill-appearing.  HENT:     Head: Normocephalic and atraumatic.     Salivary Glands: Right salivary gland is not diffusely enlarged or tender. Left salivary gland is not diffusely enlarged or tender.     Right Ear: Ear canal and external ear normal. No drainage. A middle ear effusion is present. There is no impacted cerumen. Tympanic membrane is bulging. Tympanic membrane is not injected or erythematous.     Left Ear: Ear canal and external ear normal. No drainage. A middle ear effusion is present. There is no impacted cerumen. Tympanic membrane is bulging. Tympanic membrane is not injected or erythematous.     Ears:     Comments: Bilateral EACs normal, both TMs bulging with clear fluid    Nose: Rhinorrhea present. No nasal deformity, septal deviation, signs of injury, nasal tenderness, mucosal edema or congestion. Rhinorrhea is clear.     Right Nostril: Occlusion present. No foreign body, epistaxis or septal hematoma.     Left Nostril: Occlusion present. No foreign body, epistaxis or septal hematoma.     Right Turbinates: Enlarged, swollen and pale.     Left Turbinates: Enlarged, swollen and pale.     Right Sinus: No maxillary sinus tenderness or frontal sinus tenderness.     Left Sinus: No maxillary sinus tenderness or frontal sinus tenderness.     Mouth/Throat:     Lips: Pink. No lesions.     Mouth: Mucous membranes are moist. No oral lesions.     Pharynx: Oropharynx is clear. Uvula midline. No posterior oropharyngeal erythema or uvula  swelling.     Tonsils: No tonsillar exudate. 0 on the right. 0 on the left.     Comments: Postnasal drip Eyes:     General: Lids are normal.        Right eye: No discharge.        Left eye: No discharge.     Extraocular Movements: Extraocular movements intact.     Conjunctiva/sclera: Conjunctivae normal.     Right eye: Right conjunctiva is not injected.     Left eye: Left conjunctiva is  not injected.  Neck:     Trachea: Trachea and phonation normal.  Cardiovascular:     Rate and Rhythm: Normal rate and regular rhythm.     Pulses: Normal pulses.     Heart sounds: Normal heart sounds. No murmur heard.    No friction rub. No gallop.  Pulmonary:     Effort: Pulmonary effort is normal. No tachypnea, bradypnea, accessory muscle usage, prolonged expiration, respiratory distress or retractions.     Breath sounds: Normal breath sounds. No stridor, decreased air movement or transmitted upper airway sounds. No decreased breath sounds, wheezing, rhonchi or rales.     Comments: Bronchospasm with cough Chest:     Chest wall: No tenderness.  Musculoskeletal:        General: Normal range of motion.     Cervical back: Normal range of motion and neck supple. Normal range of motion.  Lymphadenopathy:     Cervical: No cervical adenopathy.  Skin:    General: Skin is warm and dry.     Findings: No erythema or rash.  Neurological:     General: No focal deficit present.     Mental Status: She is alert and oriented to person, place, and time.  Psychiatric:        Mood and Affect: Mood normal.        Behavior: Behavior normal.     Visual Acuity Right Eye Distance:   Left Eye Distance:   Bilateral Distance:    Right Eye Near:   Left Eye Near:    Bilateral Near:     UC Couse / Diagnostics / Procedures:     Radiology No results found.  Procedures Procedures (including critical care time) EKG  Pending results:  Labs Reviewed - No data to display  Medications Ordered in  UC: Medications  triamcinolone acetonide (KENALOG-40) injection 40 mg (has no administration in time range)  albuterol (PROVENTIL) (2.5 MG/3ML) 0.083% nebulizer solution 2.5 mg (2.5 mg Nebulization Given 10/26/22 1059)    UC Diagnoses / Final Clinical Impressions(s)   I have reviewed the triage vital signs and the nursing notes.  Pertinent labs & imaging results that were available during my care of the patient were reviewed by me and considered in my medical decision making (see chart for details).    Final diagnoses:  Seasonal allergic rhinitis, unspecified trigger  Mild intermittent reactive airway disease with acute exacerbation  Allergic conjunctivitis of left eye   Patient advised that physical exam findings concerning for uncontrolled respiratory allergies, allergy medications prescribed and recommended to continue through spring to reduce risk of further respiratory infections.  Patient had excellent response to nebulized bronchodilator treatment during her visit today and was agreeable to an injection of triamcinolone to expedite resolution of respiratory inflammation. Please see discharge instructions below for further details of plan of care as provided to patient. ED Prescriptions     Medication Sig Dispense Auth. Provider   fexofenadine (ALLEGRA) 180 MG tablet Take 1 tablet (180 mg total) by mouth daily. 30 tablet Theadora Rama Scales, PA-C   ipratropium (ATROVENT) 0.06 % nasal spray Place 2 sprays into both nostrils 3 (three) times daily. As needed for nasal congestion, runny nose 15 mL Theadora Rama Scales, PA-C   albuterol (VENTOLIN HFA) 108 (90 Base) MCG/ACT inhaler Inhale 2 puffs into the lungs every 6 (six) hours as needed for wheezing or shortness of breath (Cough). 18 g Theadora Rama Scales, PA-C   Spacer/Aero-Holding Chambers (AEROCHAMBER PLUS FLO-VU LARGE) MISC 1 each  by Other route once for 1 dose. 1 each Theadora Rama Scales, PA-C   mometasone (NASONEX) 50  MCG/ACT nasal spray Place 2 sprays into the nose daily. 1 each Theadora Rama Scales, PA-C   promethazine-dextromethorphan (PROMETHAZINE-DM) 6.25-15 MG/5ML syrup Take 5 mLs by mouth at bedtime as needed for cough. 60 mL Theadora Rama Scales, PA-C   Olopatadine HCl (PATADAY) 0.2 % SOLN Apply 1 drop to eye daily. 2.5 mL Theadora Rama Scales, PA-C      PDMP not reviewed this encounter.  Disposition Upon Discharge:  Condition: stable for discharge home Home: take medications as prescribed; routine discharge instructions as discussed; follow up as advised.  Patient presented with an acute illness with associated systemic symptoms and significant discomfort requiring urgent management. In my opinion, this is a condition that a prudent lay person (someone who possesses an average knowledge of health and medicine) may potentially expect to result in complications if not addressed urgently such as respiratory distress, impairment of bodily function or dysfunction of bodily organs.   Routine symptom specific, illness specific and/or disease specific instructions were discussed with the patient and/or caregiver at length.   As such, the patient has been evaluated and assessed, work-up was performed and treatment was provided in alignment with urgent care protocols and evidence based medicine.  Patient/parent/caregiver has been advised that the patient may require follow up for further testing and treatment if the symptoms continue in spite of treatment, as clinically indicated and appropriate.  If the patient was tested for COVID-19, Influenza and/or RSV, then the patient/parent/guardian was advised to isolate at home pending the results of his/her diagnostic coronavirus test and potentially longer if they're positive. I have also advised pt that if his/her COVID-19 test returns positive, it's recommended to self-isolate for at least 10 days after symptoms first appeared AND until fever-free for 24 hours  without fever reducer AND other symptoms have improved or resolved. Discussed self-isolation recommendations as well as instructions for household member/close contacts as per the [facility] and [place] [facility], and also gave patient the COVID packet with this information.  Patient/parent/caregiver has been advised to return to the [facility] or PCP in 3-5 days if no better; to PCP or the Emergency Department if new signs and symptoms develop, or if the current signs or symptoms continue to change or worsen for further workup, evaluation and treatment as clinically indicated and appropriate  The patient will follow up with their current PCP if and as advised. If the patient does not currently have a PCP we will assist them in obtaining one.   The patient may need specialty follow up if the symptoms continue, in spite of conservative treatment and management, for further workup, evaluation, consultation and treatment as clinically indicated and appropriate.  Patient/parent/caregiver verbalized understanding and agreement of plan as discussed.  All questions were addressed during visit.  Please see discharge instructions below for further details of plan.  Discharge Instructions:   Discharge Instructions      Your symptoms and my physical exam findings are concerning for exacerbation of your underlying allergies.     Please see the list below for recommended medications, dosages and frequencies to provide relief of current symptoms:     Kenalog IM (triamcinolone):  To quickly address your significant respiratory inflammation, you were provided with an injection of Kenalog in the office today.  You should continue to feel the full benefit of the steroid for the next 24-36 hours.    Allegra (fexofenadine): This is  an excellent second-generation antihistamine that helps to reduce respiratory inflammatory response to environmental allergens.  This medication is not known to cause daytime sleepiness so it can be taken in  the daytime.  If you find that it does make you sleepy, please feel free to take it at bedtime.   Nasonex (mometasone): This is a steroid nasal spray that you use once daily, 2 sprays in each nare.  This medication does not work well if you decide to use it only used as you feel you need to, it works best used on a daily basis.  After 3 to 5 days of use, you will notice significant reduction of the inflammation and mucus production that is currently being caused by exposure to allergens, whether seasonal or environmental.  The most common side effect of this medication is nosebleeds.  If you experience a nosebleed, please discontinue use for 1 week, then feel free to resume.  I have provided you with a prescription.     Atrovent (ipratropium): This is an excellent nasal decongestant spray I have added to your recommended nasal steroid that will not cause rebound congestion, please instill 2 sprays into each nare with each use.  Because nasal steroids can take several days before they begin to provide full benefit, I recommend that you use this spray in addition to the nasal steroid prescribed for you.  Please use it after you have used your nasal steroid and repeat up to 4 times daily as needed.  I have provided you with a prescription for this medication.     Pataday (olopatadine): This is an antihistamine eyedrop that can be used once daily to help relieve dry eyes, itchy eyes and red eyes.  This antihistamine drop not only works for allergic conjunctivitis but is also very helpful with viral conjunctivitis.  Please do not use this drop more than once a day, for best relief please use this in the morning.     ProAir, Ventolin, Proventil (albuterol): This inhaled medication contains a short acting beta agonist bronchodilator.  This medication works on the smooth muscle that opens and constricts of your airways by relaxing the muscle.  The result of relaxation of the smooth muscle is increased air movement  and improved work of breathing.  This is a short acting medication that can be used every 4-6 hours as needed for increased work of breathing, shortness of breath, wheezing and excessive coughing.  I have provided you with a prescription.    Promethazine DM: Promethazine is both a nasal decongestant and an antinausea medication that makes most patients feel fairly sleepy.  The DM is dextromethorphan, a cough suppressant found in many over-the-counter cough medications.  Please take 5 mL before bedtime to minimize your cough which will help you sleep better.  I have sent a prescription for this medication to your pharmacy.   Please follow-up within the next 5-7 days either with your primary care provider or urgent care if your symptoms do not resolve.  If you do not have a primary care provider, we will assist you in finding one.        Thank you for visiting urgent care today.  We appreciate the opportunity to participate in your care.     This office note has been dictated using Teaching laboratory technician.  Unfortunately, this method of dictation can sometimes lead to typographical or grammatical errors.  I apologize for your inconvenience in advance if this occurs.  Please do not hesitate  to reach out to me if clarification is needed.      Theadora Rama Scales, [place] 10/27/22 [phone]

## 2022-10-26 NOTE — ED Triage Notes (Addendum)
Pt c/o cough and itchiness/ drainage to both eyes.  Home interventions: tessalon pearls    Started: yesterday

## 2022-11-04 DIAGNOSIS — R69 Illness, unspecified: Secondary | ICD-10-CM | POA: Diagnosis not present

## 2022-11-07 ENCOUNTER — Ambulatory Visit
Admission: RE | Admit: 2022-11-07 | Discharge: 2022-11-07 | Disposition: A | Discharge status: Home / Self Care | Payer: 59 | Source: Ambulatory Visit | Attending: Family Medicine | Admitting: Family Medicine

## 2022-11-07 DIAGNOSIS — Z1231 Encounter for screening mammogram for malignant neoplasm of breast: Secondary | ICD-10-CM | POA: Diagnosis not present

## 2022-11-21 ENCOUNTER — Ambulatory Visit
Admission: RE | Admit: 2022-11-21 | Discharge: 2022-11-21 | Disposition: A | Discharge status: Home / Self Care | Payer: 59 | Source: Ambulatory Visit | Attending: Family Medicine | Admitting: Family Medicine

## 2022-11-21 ENCOUNTER — Other Ambulatory Visit: Payer: Self-pay | Admitting: Family Medicine

## 2022-11-21 DIAGNOSIS — J209 Acute bronchitis, unspecified: Secondary | ICD-10-CM | POA: Diagnosis not present

## 2022-11-21 DIAGNOSIS — R059 Cough, unspecified: Secondary | ICD-10-CM | POA: Diagnosis not present

## 2023-01-27 DIAGNOSIS — J0100 Acute maxillary sinusitis, unspecified: Secondary | ICD-10-CM | POA: Diagnosis not present

## 2023-01-31 DIAGNOSIS — M5442 Lumbago with sciatica, left side: Secondary | ICD-10-CM | POA: Diagnosis not present

## 2023-01-31 DIAGNOSIS — M545 Low back pain, unspecified: Secondary | ICD-10-CM | POA: Diagnosis not present

## 2023-02-12 DIAGNOSIS — M545 Low back pain, unspecified: Secondary | ICD-10-CM | POA: Diagnosis not present

## 2023-02-13 DIAGNOSIS — M4726 Other spondylosis with radiculopathy, lumbar region: Secondary | ICD-10-CM | POA: Diagnosis not present

## 2023-03-14 DIAGNOSIS — U071 COVID-19: Secondary | ICD-10-CM | POA: Diagnosis not present

## 2023-03-26 ENCOUNTER — Other Ambulatory Visit (HOSPITAL_COMMUNITY): Payer: Self-pay | Admitting: Internal Medicine

## 2023-03-26 DIAGNOSIS — Z86010 Personal history of colonic polyps: Secondary | ICD-10-CM | POA: Diagnosis not present

## 2023-03-26 DIAGNOSIS — U071 COVID-19: Secondary | ICD-10-CM | POA: Diagnosis not present

## 2023-03-26 DIAGNOSIS — F411 Generalized anxiety disorder: Secondary | ICD-10-CM | POA: Diagnosis not present

## 2023-03-26 DIAGNOSIS — M810 Age-related osteoporosis without current pathological fracture: Secondary | ICD-10-CM | POA: Diagnosis not present

## 2023-03-26 DIAGNOSIS — K219 Gastro-esophageal reflux disease without esophagitis: Secondary | ICD-10-CM | POA: Diagnosis not present

## 2023-03-26 DIAGNOSIS — E7800 Pure hypercholesterolemia, unspecified: Secondary | ICD-10-CM | POA: Diagnosis not present

## 2023-03-26 DIAGNOSIS — N814 Uterovaginal prolapse, unspecified: Secondary | ICD-10-CM | POA: Diagnosis not present

## 2023-03-26 DIAGNOSIS — F39 Unspecified mood [affective] disorder: Secondary | ICD-10-CM | POA: Diagnosis not present

## 2023-03-26 DIAGNOSIS — M150 Primary generalized (osteo)arthritis: Secondary | ICD-10-CM | POA: Diagnosis not present

## 2023-03-26 DIAGNOSIS — G43009 Migraine without aura, not intractable, without status migrainosus: Secondary | ICD-10-CM | POA: Diagnosis not present

## 2023-03-26 DIAGNOSIS — Z136 Encounter for screening for cardiovascular disorders: Secondary | ICD-10-CM

## 2023-03-26 DIAGNOSIS — Z0000 Encounter for general adult medical examination without abnormal findings: Secondary | ICD-10-CM | POA: Diagnosis not present

## 2023-03-26 DIAGNOSIS — M5136 Other intervertebral disc degeneration, lumbar region: Secondary | ICD-10-CM | POA: Diagnosis not present

## 2023-03-31 ENCOUNTER — Other Ambulatory Visit: Payer: Self-pay | Admitting: Internal Medicine

## 2023-03-31 DIAGNOSIS — M810 Age-related osteoporosis without current pathological fracture: Secondary | ICD-10-CM

## 2023-04-02 DIAGNOSIS — M545 Low back pain, unspecified: Secondary | ICD-10-CM | POA: Diagnosis not present

## 2023-04-02 DIAGNOSIS — M533 Sacrococcygeal disorders, not elsewhere classified: Secondary | ICD-10-CM | POA: Diagnosis not present

## 2023-04-04 DIAGNOSIS — M5416 Radiculopathy, lumbar region: Secondary | ICD-10-CM | POA: Diagnosis not present

## 2023-04-10 ENCOUNTER — Ambulatory Visit (HOSPITAL_COMMUNITY)
Admission: RE | Admit: 2023-04-10 | Discharge: 2023-04-10 | Disposition: A | Discharge status: Home / Self Care | Payer: Self-pay | Source: Ambulatory Visit | Attending: Internal Medicine | Admitting: Internal Medicine

## 2023-04-10 DIAGNOSIS — Z136 Encounter for screening for cardiovascular disorders: Secondary | ICD-10-CM | POA: Insufficient documentation

## 2023-04-14 DIAGNOSIS — M533 Sacrococcygeal disorders, not elsewhere classified: Secondary | ICD-10-CM | POA: Diagnosis not present

## 2023-04-16 ENCOUNTER — Other Ambulatory Visit (HOSPITAL_COMMUNITY): Payer: Self-pay | Admitting: *Deleted

## 2023-04-18 ENCOUNTER — Encounter (HOSPITAL_COMMUNITY)
Admission: RE | Admit: 2023-04-18 | Discharge: 2023-04-18 | Disposition: A | Discharge status: Home / Self Care | Payer: 59 | Source: Ambulatory Visit | Attending: Internal Medicine | Admitting: Internal Medicine

## 2023-04-18 DIAGNOSIS — M810 Age-related osteoporosis without current pathological fracture: Secondary | ICD-10-CM | POA: Diagnosis not present

## 2023-04-18 DIAGNOSIS — Z87311 Personal history of (healed) other pathological fracture: Secondary | ICD-10-CM | POA: Diagnosis not present

## 2023-04-18 MED ORDER — ZOLEDRONIC ACID 5 MG/100ML IV SOLN: 5.0000 mg | Freq: Once | INTRAVENOUS | Status: AC

## 2023-04-18 MED ORDER — ZOLEDRONIC ACID 5 MG/100ML IV SOLN
INTRAVENOUS | Status: AC
  Administered 2023-04-18: 5 mg via INTRAVENOUS
  Filled 2023-04-18: qty 100

## 2023-04-21 DIAGNOSIS — M533 Sacrococcygeal disorders, not elsewhere classified: Secondary | ICD-10-CM | POA: Diagnosis not present

## 2023-04-28 ENCOUNTER — Ambulatory Visit (HOSPITAL_COMMUNITY)
Admission: RE | Admit: 2023-04-28 | Discharge: 2023-04-28 | Disposition: A | Discharge status: Home / Self Care | Payer: 59 | Source: Ambulatory Visit

## 2023-04-28 ENCOUNTER — Ambulatory Visit (INDEPENDENT_AMBULATORY_CARE_PROVIDER_SITE_OTHER): Payer: 59

## 2023-04-28 ENCOUNTER — Encounter (HOSPITAL_COMMUNITY): Payer: Self-pay

## 2023-04-28 VITALS — BP 124/83 | HR 85 | Temp 98.7°F | Resp 16 | Ht 67.0 in | Wt 140.0 lb

## 2023-04-28 DIAGNOSIS — J40 Bronchitis, not specified as acute or chronic: Secondary | ICD-10-CM | POA: Diagnosis not present

## 2023-04-28 DIAGNOSIS — R062 Wheezing: Secondary | ICD-10-CM | POA: Diagnosis not present

## 2023-04-28 DIAGNOSIS — R0602 Shortness of breath: Secondary | ICD-10-CM | POA: Diagnosis not present

## 2023-04-28 DIAGNOSIS — U071 COVID-19: Secondary | ICD-10-CM | POA: Diagnosis not present

## 2023-04-28 MED ORDER — AZITHROMYCIN 250 MG PO TABS: ORAL_TABLET | ORAL | 0 refills | Status: AC

## 2023-04-28 NOTE — Discharge Instructions (Signed)
Follow up with your primary care doctor if the cough persists

## 2023-04-28 NOTE — ED Provider Notes (Signed)
[facility]    CSN: [id number] Arrival date & time: 04/28/23  1736      History   Chief Complaint Chief Complaint  Patient presents with   Cough   Appt 1800    HPI Savannah Herring is a 67 y.o. female presents with cough and SOB and wheezing . Had covid 6/16. Has been coughing for 8 weeks. Her cough is productive with yellow sputum. Has been using an inhaler and does not help. Has had 2 rounds of steroids for her back, and does not want prednisone since she has osteoporosis. Has not had a fever. Has not been very active due to back pain x 2 months. Has ST on L side. Denies rhinitis of PND    Past Medical History:  Diagnosis Date   Anxiety    Depression    Medical history non-contributory    Wears glasses     Patient Active Problem List   Diagnosis Date Noted   Osteoporosis 03/29/2022   Anxiety 03/18/2015    Past Surgical History:  Procedure Laterality Date   COLONOSCOPY     SINUS ENDO W/FUSION Right 12/27/2013   Procedure: ENDOSCOPIC SINUS SURGERY WITH BIOPSY, FROZEN SECTION,EXCISION RIGHT NASAL SEPTAL MASS ANDFUSION NAVIGATION;  Surgeon: Flo Shanks, MD;  Location: [facility];  Service: ENT;  Laterality: Right;   WISDOM TOOTH EXTRACTION      OB History   No obstetric history on file.      Home Medications    Prior to Admission medications   Medication Sig Start Date End Date Taking? Authorizing Provider  albuterol (VENTOLIN HFA) 108 (90 Base) MCG/ACT inhaler Inhale 2 puffs into the lungs every 6 (six) hours as needed for wheezing or shortness of breath (Cough). 10/26/22  Yes Theadora Rama Scales, PA-C  azithromycin (ZITHROMAX Z-PAK) 250 MG tablet 2 today, then one every day x 4 days 04/28/23  Yes Rodriguez-Southworth, Nettie Elm, PA-C  calcium-vitamin D (OSCAL WITH D) 500-200 MG-UNIT per tablet Take 1 tablet by mouth.   Yes [provider]  esomeprazole (NEXIUM) 10 MG packet Take 10 mg by mouth daily before breakfast.   Yes [provider]  FLUoxetine (PROZAC) 20 MG capsule Take 60 mg by mouth daily.   Yes [provider]  Multiple Vitamins-Minerals (MULTIVITAMIN WITH MINERALS) tablet Take 1 tablet by mouth daily.   Yes [provider]  ipratropium (ATROVENT) 0.06 % nasal spray Place 2 sprays into both nostrils 3 (three) times daily. As needed for nasal congestion, runny nose 10/26/22   Theadora Rama Scales, PA-C  mometasone (NASONEX) 50 MCG/ACT nasal spray Place 2 sprays into the nose daily. 10/26/22 01/24/23  Theadora Rama Scales, PA-C  Olopatadine HCl (PATADAY) 0.2 % SOLN Apply 1 drop to eye daily. 10/26/22   Theadora Rama Scales, PA-C    Family History Family History  Problem Relation Age of Onset   Cancer Father    Hypertension Father     Social History Social History   Tobacco Use   Smoking status: Never  Substance Use Topics   Alcohol use: Yes    Comment: occ   Drug use: No     Allergies   Sulfa antibiotics   Review of Systems Review of Systems As noted in HPI  Physical Exam Triage Vital Signs ED Triage Vitals  Enc Vitals Group     BP 04/28/23 1801 124/83     Pulse Rate 04/28/23 1801 85     Resp 04/28/23 1801 16  Temp 04/28/23 1801 98.7 F (37.1 C)     Temp Source 04/28/23 1801 Oral     SpO2 04/28/23 1801 94 %     Weight 04/28/23 1801 140 lb (63.5 kg)     Height 04/28/23 1801 5\' 7"  (1.702 m)     Head Circumference --      Peak Flow --      Pain Score 04/28/23 1800 7     Pain Loc --      Pain Edu? --      Excl. in GC? --    No data found.  Updated Vital Signs BP 124/83 (BP Location: Left Arm)   Pulse 85   Temp 98.7 F (37.1 C) (Oral)   Resp 16   Ht 5\' 7"  (1.702 m)   Wt 140 lb (63.5 kg)   SpO2 94%   BMI 21.93 kg/m   Visual Acuity Right Eye Distance:   Left Eye Distance:   Bilateral Distance:    Right Eye Near:   Left Eye Near:    Bilateral Near:     Physical Exam Physical Exam Constitutional:      General: He is not in acute  distress.    Appearance: He is not toxic-appearing.  HENT:     Head: Normocephalic.     Right Ear: Tympanic membrane, ear canal and external ear normal.     Left Ear: Ear canal and external ear normal.     Nose: Nose normal.     Mouth/Throat:     Mouth: Mucous membranes are moist.     Pharynx: Oropharynx is clear.  Eyes:     General: No scleral icterus.    Conjunctiva/sclera: Conjunctivae normal.  Cardiovascular:     Rate and Rhythm: Normal rate and regular rhythm.     Heart sounds: No murmur heard.   Pulmonary:     Effort: Pulmonary effort is normal. No respiratory distress. Has had several episodes of cough attacks.    Breath sounds: faint crackles heard on bases, the rest is clear.     Musculoskeletal:        General: Normal range of motion.     Cervical back: Neck supple. No edema noted.  Lymphadenopathy:     Cervical: No cervical adenopathy.  Skin:    General: Skin is warm and dry.     Findings: No rash.  Neurological:     Mental Status: He is alert and oriented to person, place, and time.     Gait: Gait normal.  Psychiatric:        Mood and Affect: Mood normal.        Behavior: Behavior normal.        Thought Content: Thought content normal.        Judgment: Judgment normal.    UC Treatments / Results  Labs (all labs ordered are listed, but only abnormal results are displayed) Labs Reviewed - No data to display  EKG   Radiology DG Chest 2 View  Result Date: 04/28/2023 CLINICAL DATA:  Shortness of breath, wheezing, COVID EXAM: CHEST - 2 VIEW COMPARISON:  11/21/2022 FINDINGS: Lungs are clear.  No pleural effusion or pneumothorax. The heart is normal in size. Mild superior endplate compression fracture deformity of a lower thoracic vertebral body, likely T10, chronic. IMPRESSION: Normal chest radiographs. Electronically Signed   By: Charline Bills M.D.   On: 04/28/2023 19:14    Procedures Procedures (including critical care time)  Medications Ordered in  UC Medications -  No data to display  Initial Impression / Assessment and Plan / UC Course  I have reviewed the triage vital signs and the nursing notes.  Pertinent imaging results that were available during my care of the patient were reviewed by me and considered in my medical decision making (see chart for details).  Acute bronchitis  I placed her on Zpack as noted which she has tolerated fine in the past.   Final Clinical Impressions(s) / UC Diagnoses   Final diagnoses:  Bronchitis     Discharge Instructions      Follow up with your primary care doctor if the cough persists      ED Prescriptions     Medication Sig Dispense Auth. Provider   azithromycin (ZITHROMAX Z-PAK) 250 MG tablet 2 today, then one every day x 4 days 6 tablet Rodriguez-Southworth, Nettie Elm, PA-C      PDMP not reviewed this encounter.   Garey Ham, [place] 04/28/23 1942

## 2023-04-28 NOTE — ED Triage Notes (Signed)
Patient here today with c/o cough shortly after testing positive for Covid on 04/13/2023. Patient states that her lungs hurt and she has been coughing up yellowish mucous and would like to have an x-ray done. She has been wheezing and having some SOB.

## 2023-04-30 DIAGNOSIS — M533 Sacrococcygeal disorders, not elsewhere classified: Secondary | ICD-10-CM | POA: Diagnosis not present

## 2023-05-02 DIAGNOSIS — M545 Low back pain, unspecified: Secondary | ICD-10-CM | POA: Diagnosis not present

## 2023-05-05 DIAGNOSIS — M533 Sacrococcygeal disorders, not elsewhere classified: Secondary | ICD-10-CM | POA: Diagnosis not present

## 2023-05-07 DIAGNOSIS — M533 Sacrococcygeal disorders, not elsewhere classified: Secondary | ICD-10-CM | POA: Diagnosis not present

## 2023-05-13 DIAGNOSIS — M5416 Radiculopathy, lumbar region: Secondary | ICD-10-CM | POA: Diagnosis not present

## 2023-05-21 DIAGNOSIS — M79662 Pain in left lower leg: Secondary | ICD-10-CM | POA: Diagnosis not present

## 2023-05-21 DIAGNOSIS — M545 Low back pain, unspecified: Secondary | ICD-10-CM | POA: Diagnosis not present

## 2023-05-21 DIAGNOSIS — M5442 Lumbago with sciatica, left side: Secondary | ICD-10-CM | POA: Diagnosis not present

## 2023-05-21 DIAGNOSIS — M9903 Segmental and somatic dysfunction of lumbar region: Secondary | ICD-10-CM | POA: Diagnosis not present

## 2023-05-30 DIAGNOSIS — M4727 Other spondylosis with radiculopathy, lumbosacral region: Secondary | ICD-10-CM | POA: Diagnosis not present

## 2023-05-30 DIAGNOSIS — M5136 Other intervertebral disc degeneration, lumbar region: Secondary | ICD-10-CM | POA: Diagnosis not present

## 2023-06-03 ENCOUNTER — Encounter: Payer: Self-pay | Admitting: Surgery

## 2023-06-03 ENCOUNTER — Other Ambulatory Visit: Payer: Self-pay | Admitting: Surgery

## 2023-06-03 DIAGNOSIS — M5136 Other intervertebral disc degeneration, lumbar region: Secondary | ICD-10-CM

## 2023-06-05 NOTE — Discharge Instructions (Signed)

## 2023-06-06 ENCOUNTER — Ambulatory Visit
Admission: RE | Admit: 2023-06-06 | Discharge: 2023-06-06 | Disposition: A | Discharge status: Home / Self Care | Payer: 59 | Source: Ambulatory Visit | Attending: Surgery | Admitting: Surgery

## 2023-06-06 DIAGNOSIS — M5136 Other intervertebral disc degeneration, lumbar region: Secondary | ICD-10-CM

## 2023-06-06 DIAGNOSIS — M79662 Pain in left lower leg: Secondary | ICD-10-CM | POA: Diagnosis not present

## 2023-06-06 DIAGNOSIS — M545 Low back pain, unspecified: Secondary | ICD-10-CM | POA: Diagnosis not present

## 2023-06-06 MED ORDER — METHYLPREDNISOLONE ACETATE 40 MG/ML INJ SUSP (RADIOLOG
80.0000 mg | Freq: Once | INTRAMUSCULAR | Status: AC
  Administered 2023-06-06: 80 mg via EPIDURAL

## 2023-06-06 MED ORDER — IOPAMIDOL (ISOVUE-M 200) INJECTION 41%
1.0000 mL | Freq: Once | INTRAMUSCULAR | Status: AC
  Administered 2023-06-06: 1 mL via EPIDURAL

## 2023-12-05 ENCOUNTER — Ambulatory Visit
Admission: RE | Admit: 2023-12-05 | Discharge: 2023-12-05 | Disposition: A | Discharge status: Home / Self Care | Payer: Medicare Other | Source: Ambulatory Visit | Attending: Internal Medicine

## 2023-12-05 DIAGNOSIS — M810 Age-related osteoporosis without current pathological fracture: Secondary | ICD-10-CM

## 2024-03-15 ENCOUNTER — Other Ambulatory Visit: Payer: Self-pay

## 2024-03-15 ENCOUNTER — Encounter: Payer: Self-pay | Admitting: Neurology

## 2024-03-15 DIAGNOSIS — R202 Paresthesia of skin: Secondary | ICD-10-CM

## 2024-04-29 ENCOUNTER — Ambulatory Visit: Admitting: Neurology

## 2024-04-29 DIAGNOSIS — R202 Paresthesia of skin: Secondary | ICD-10-CM

## 2024-04-29 DIAGNOSIS — G5732 Lesion of lateral popliteal nerve, left lower limb: Secondary | ICD-10-CM

## 2024-04-29 NOTE — Procedures (Signed)
 [facility]  [address], [address]  [place], [place] [address] Tel: [phone] Fax: [phone] Test Date:  04/29/2024  Patient: Savannah Herring DOB: 09/17/1956 Physician: Tonita Blanch, DO  Sex: Female Height: 5' 7 Ref Phys: Camie Pickle, PA-C  ID#: [id number]   Technician:    History: This is a 68 year old female referred for evaluation of left foot drop.  NCV & EMG Findings: Extensive electrodiagnostic testing of the left lower extremity and additional studies of the right shows:  Left superficial peroneal sensory response shows reduced amplitude (2.7 V).  Right superficial peroneal and bilateral sural sensory responses are within normal limits. Left peroneal motor (EDB) response shows reduced amplitude (2.3 mV) and decreased conduction velocity (Poplt-B Fib, 33 m/s).  Right peroneal and bilateral tibial motor responses are within normal limits.  Bilateral tibial H reflex studies are within normal limits. 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 There is no evidence of a lumbosacral radiculopathy or large fiber sensorimotor polyneuropathy affecting the lower extremities.   ___________________________ Tonita Blanch, DO    Nerve Conduction Studies   Stim Site NR Peak (ms) Norm Peak (ms) O-P Amp (V) Norm O-P Amp  Left Sup Peroneal Anti Sensory (Ant Lat Mall)  32 C  12 cm    2.4 <4.6 *2.7 >3  Right Sup Peroneal Anti Sensory (Ant Lat Mall)  32 C  12 cm    2.3 <4.6 9.4 >3  Left Sural Anti Sensory (Lat Mall)  32 C  Calf    2.9 <4.6 13.7 >3  Right Sural Anti Sensory (Lat Mall)  32 C  Calf    2.6 <4.6 12.7 >3     Stim Site NR Onset (ms) Norm Onset (ms) O-P Amp (mV) Norm O-P Amp Site1 Site2 Delta-0 (ms) Dist (cm) Vel (m/s) Norm Vel (m/s)   Left Peroneal Motor (Ext Dig Brev)  32 C  Ankle    3.8 <6.0 *2.3 >2.5 B Fib Ankle 7.8 37.0 47 >40  B Fib    11.6  1.6  Poplt B Fib 2.1 7.0 *33 >40  Poplt    13.7  1.5         Right Peroneal Motor (Ext Dig Brev)  32 C  Ankle    2.7 <6.0 5.0 >2.5 B Fib Ankle 7.8 39.0 50 >40  B Fib    10.5  4.3  Poplt B Fib 1.4 7.0 50 >40  Poplt    11.9  4.3         Left Peroneal TA Motor (Tib Ant)  32 C  Fib Head    2.8 <4.5 4.0 >3 Poplit Fib Head 1.6 7.0 44 >40  Poplit    4.4 <5.7 3.7         Left Tibial Motor (Abd Hall Brev)  32 C  Ankle    3.8 <6.0 16.4 >4 Knee Ankle 9.6 43.0 45 >40  Knee    13.4  13.3         Right Tibial Motor (Abd Hall Brev)  32 C  Ankle    3.9 <6.0 15.6 >4 Knee Ankle 8.4 44.0 52 >40  Knee    12.3  12.0          Electromyography   Side Muscle Ins.Act Fibs Fasc Recrt Amp Dur Poly Activation Comment  Right AntTibialis Nml Nml  Nml Nml Nml Nml Nml Nml N/A  Right Gastroc Nml Nml Nml Nml Nml Nml Nml Nml N/A  Right Flex Dig Long Nml Nml Nml Nml Nml Nml Nml Nml N/A  Right RectFemoris Nml Nml Nml Nml Nml Nml Nml Nml N/A  Right BicepsFemS Nml Nml Nml Nml Nml Nml Nml Nml N/A  Right GluteusMed Nml Nml Nml Nml Nml Nml Nml Nml N/A  Left AntTibialis Nml Nml Nml *1- *1+ *1+ Nml Nml N/A  Left Gastroc Nml Nml Nml Nml Nml Nml Nml Nml N/A  Left Flex Dig Long Nml Nml Nml Nml Nml Nml Nml Nml N/A  Left RectFemoris Nml Nml Nml Nml Nml Nml Nml Nml N/A  Left GluteusMed Nml Nml Nml Nml Nml Nml Nml Nml N/A  Left ExtHallLong Nml Nml Nml *1- *1+ *1+ Nml Nml N/A  Left Fibularis Long Nml Nml Nml *1- *1+ *1+ Nml Nml N/A      Waveforms:

## 2024-06-03 ENCOUNTER — Other Ambulatory Visit: Payer: Self-pay | Admitting: Internal Medicine

## 2024-06-03 DIAGNOSIS — Z1231 Encounter for screening mammogram for malignant neoplasm of breast: Secondary | ICD-10-CM

## 2024-06-25 ENCOUNTER — Ambulatory Visit
Admission: RE | Admit: 2024-06-25 | Discharge: 2024-06-25 | Disposition: A | Discharge status: Home / Self Care | Source: Ambulatory Visit | Attending: Internal Medicine | Admitting: Internal Medicine

## 2024-06-25 DIAGNOSIS — Z1231 Encounter for screening mammogram for malignant neoplasm of breast: Secondary | ICD-10-CM
